# Patient Record
Sex: Female | Born: 1960 | Race: White | Hispanic: No | Smoking: Former smoker
Health system: Southern US, Community
[De-identification: ages and names within clinical notes are randomized; demographics above are authoritative.]

## PROBLEM LIST (undated history)

## (undated) DIAGNOSIS — M1990 Unspecified osteoarthritis, unspecified site: Secondary | ICD-10-CM

## (undated) DIAGNOSIS — K449 Diaphragmatic hernia without obstruction or gangrene: Secondary | ICD-10-CM

## (undated) DIAGNOSIS — T8859XA Other complications of anesthesia, initial encounter: Secondary | ICD-10-CM

## (undated) DIAGNOSIS — Z889 Allergy status to unspecified drugs, medicaments and biological substances status: Secondary | ICD-10-CM

## (undated) DIAGNOSIS — D649 Anemia, unspecified: Secondary | ICD-10-CM

## (undated) DIAGNOSIS — K8020 Calculus of gallbladder without cholecystitis without obstruction: Secondary | ICD-10-CM

## (undated) DIAGNOSIS — K219 Gastro-esophageal reflux disease without esophagitis: Secondary | ICD-10-CM

## (undated) DIAGNOSIS — T4145XA Adverse effect of unspecified anesthetic, initial encounter: Secondary | ICD-10-CM

## (undated) DIAGNOSIS — J3489 Other specified disorders of nose and nasal sinuses: Secondary | ICD-10-CM

## (undated) HISTORY — PX: COLONOSCOPY W/ POLYPECTOMY: SHX1380

## (undated) HISTORY — DX: Diaphragmatic hernia without obstruction or gangrene: K44.9

## (undated) HISTORY — DX: Unspecified osteoarthritis, unspecified site: M19.90

## (undated) HISTORY — DX: Gastro-esophageal reflux disease without esophagitis: K21.9

---

## 1999-11-12 ENCOUNTER — Encounter (INDEPENDENT_AMBULATORY_CARE_PROVIDER_SITE_OTHER): Payer: Self-pay | Admitting: *Deleted

## 1999-11-12 LAB — CONVERTED CEMR LAB

## 2000-07-15 ENCOUNTER — Other Ambulatory Visit: Admission: RE | Admit: 2000-07-15 | Discharge: 2000-07-15 | Payer: Self-pay | Admitting: *Deleted

## 2002-11-17 ENCOUNTER — Encounter: Admission: RE | Admit: 2002-11-17 | Discharge: 2002-11-17 | Payer: Self-pay | Admitting: Family Medicine

## 2005-01-07 ENCOUNTER — Ambulatory Visit: Payer: Self-pay | Admitting: Internal Medicine

## 2005-02-04 ENCOUNTER — Ambulatory Visit: Payer: Self-pay | Admitting: Internal Medicine

## 2005-03-12 ENCOUNTER — Ambulatory Visit: Payer: Self-pay | Admitting: Internal Medicine

## 2005-03-26 ENCOUNTER — Ambulatory Visit: Payer: Self-pay | Admitting: Gastroenterology

## 2005-03-29 ENCOUNTER — Ambulatory Visit: Payer: Self-pay | Admitting: Gastroenterology

## 2005-04-01 ENCOUNTER — Ambulatory Visit: Payer: Self-pay | Admitting: Gastroenterology

## 2005-04-01 DIAGNOSIS — K449 Diaphragmatic hernia without obstruction or gangrene: Secondary | ICD-10-CM

## 2005-04-01 HISTORY — DX: Diaphragmatic hernia without obstruction or gangrene: K44.9

## 2005-04-01 HISTORY — PX: ESOPHAGOGASTRODUODENOSCOPY: SHX1529

## 2005-09-10 ENCOUNTER — Ambulatory Visit: Payer: Self-pay | Admitting: Internal Medicine

## 2006-07-18 ENCOUNTER — Emergency Department (HOSPITAL_COMMUNITY): Admission: EM | Admit: 2006-07-18 | Discharge: 2006-07-18 | Payer: Self-pay | Admitting: Family Medicine

## 2007-01-08 DIAGNOSIS — J309 Allergic rhinitis, unspecified: Secondary | ICD-10-CM | POA: Insufficient documentation

## 2007-01-09 ENCOUNTER — Encounter (INDEPENDENT_AMBULATORY_CARE_PROVIDER_SITE_OTHER): Payer: Self-pay | Admitting: *Deleted

## 2007-02-06 ENCOUNTER — Ambulatory Visit: Payer: Self-pay | Admitting: Internal Medicine

## 2007-04-28 ENCOUNTER — Ambulatory Visit: Payer: Self-pay | Admitting: Internal Medicine

## 2007-05-18 ENCOUNTER — Encounter: Admission: RE | Admit: 2007-05-18 | Discharge: 2007-05-18 | Payer: Self-pay | Admitting: Orthopedic Surgery

## 2007-07-14 ENCOUNTER — Ambulatory Visit: Payer: Self-pay | Admitting: Internal Medicine

## 2007-07-14 DIAGNOSIS — S9030XA Contusion of unspecified foot, initial encounter: Secondary | ICD-10-CM | POA: Insufficient documentation

## 2007-11-26 ENCOUNTER — Ambulatory Visit: Payer: Self-pay | Admitting: Internal Medicine

## 2007-11-26 DIAGNOSIS — J069 Acute upper respiratory infection, unspecified: Secondary | ICD-10-CM | POA: Insufficient documentation

## 2008-03-22 ENCOUNTER — Ambulatory Visit: Payer: Self-pay | Admitting: Internal Medicine

## 2008-03-22 DIAGNOSIS — R1084 Generalized abdominal pain: Secondary | ICD-10-CM | POA: Insufficient documentation

## 2008-03-22 LAB — CONVERTED CEMR LAB
Bilirubin Urine: NEGATIVE
Blood in Urine, dipstick: NEGATIVE
Glucose, Urine, Semiquant: NEGATIVE
Ketones, urine, test strip: NEGATIVE
Nitrite: NEGATIVE
Specific Gravity, Urine: >=1.03
Urobilinogen, UA: 0.2
WBC Urine, dipstick: NEGATIVE
pH: 7

## 2008-03-23 ENCOUNTER — Encounter: Payer: Self-pay | Admitting: Internal Medicine

## 2008-05-03 ENCOUNTER — Encounter: Payer: Self-pay | Admitting: Internal Medicine

## 2010-01-03 ENCOUNTER — Telehealth (INDEPENDENT_AMBULATORY_CARE_PROVIDER_SITE_OTHER): Payer: Self-pay | Admitting: *Deleted

## 2010-01-18 ENCOUNTER — Encounter: Payer: Self-pay | Admitting: Internal Medicine

## 2010-01-25 ENCOUNTER — Encounter: Payer: Self-pay | Admitting: Internal Medicine

## 2010-12-11 NOTE — Letter (Signed)
Summary: Generic Letter  [facility]  [address] [place], [place] [address]   Phone: [phone]  Fax: [phone]    05/03/2008  Lezlie Capuano [address] [place], [place]  [address]  Dear Ms. Reeg,  We have been trying to get ahold of you about your urine culture from 03/23/08. We wanted to let you know that theres was some bacteria in the urine but not consistent with a urinary tract infection. If you did take the medication that was okay. If you do have any questions, please call us at [phone].         Sincerely,   Carollee Herter, CMA [place] [facility]

## 2010-12-11 NOTE — Assessment & Plan Note (Signed)
Summary: ?uti/njr   Vital Signs:  Patient Profile:   50 Years Old Female Weight:      197 pounds Temp:     98.2 degrees F oral Pulse rate:   78 / minute BP sitting:   120 / 80  (left arm) Cuff size:   regular  Vitals Entered By: Romualdo Bolk, CMA (Mar 22, 2008 4:27 PM)                 Chief Complaint:  Burning upon urination.  History of Present Illness: [name] [name] is here foru burning, pain and pressure upon urination that started 03/21/08.  ? no frequency.   pain at urethra.    period due this week.   no vaginal symptom  Has hx of uti in the past  no fever NVD . Pt is currently not on any medications.    Prior Medications Reviewed Using: Patient Recall  Updated Prior Medication List: No Medications Current Allergies (reviewed today): No known allergies   Past Medical History:    Reviewed history from 01/08/2007 and no changes required:       G1P1 s/p CS for breech 1993              hx utis  Past Surgical History:    Caesarean section   Social History:    Reviewed history from 01/08/2007 and no changes required:       Moved from [place] 1999.  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  50yo D.  Married.  No tob/EtOH/drugs.  Likes to read, garden, travel.    Review of Systems  The patient denies anorexia, fever, abdominal pain, hematuria, incontinence, and difficulty walking.     Physical Exam  General:     alert, well-developed, and well-nourished.   Head:     normocephalic and atraumatic.   Neck:     No deformities, masses, or tenderness noted. Lungs:     normal respiratory effort and no intercostal retractions.   Heart:     normal rate and regular rhythm.   Abdomen:     Bowel sounds positive,abdomen soft and non-tender without masses, organomegaly or hernias noted. flank pain Skin:     turgor normal and color normal.      Impression & Recommendations:  Problem # 1:  DYSURIA (ICD-788.1) do culture with past hx  and will call with results   discussion Her updated medication list for this problem includes:    Cipro 500 Mg Tabs (Ciprofloxacin hcl) .Marland Kitchen... 1 by mouth two times a day .   Complete Medication List: 1)  Cipro 500 Mg Tabs (Ciprofloxacin hcl) .Marland Kitchen.. 1 by mouth two times a day  Other Orders: UA Dipstick w/o Micro (automated)  (81003) T-Culture, Urine ([id number])   Patient Instructions: 1)  will call you when urine culture back 2)  if increasing symptom then can start cipro 3)  and call as needed not better.   Prescriptions: CIPRO 500 MG  TABS (CIPROFLOXACIN HCL) 1 by mouth two times a day  #10 x 0   Entered and Authorized by:   Madelin Headings MD   Signed by:   Madelin Headings MD on 03/22/2008   Method used:   Print then Give to Patient   RxID:   [id number]  ] Laboratory Results   Urine Tests  Date/Time Received: Mar 22, 2008 4:32 PM  Date/Time Reported: Mar 22, 2008 4:32 PM   Routine Urinalysis   Color: yellow Appearance: Clear  Glucose: negative   (Normal Range: Negative) Bilirubin: negative   (Normal Range: Negative) Ketone: negative   (Normal Range: Negative) Spec. Gravity: >=1.030   (Normal Range: 1.003-1.035) Blood: negative   (Normal Range: Negative) pH: 7.0   (Normal Range: 5.0-8.0) Protein: 1+   (Normal Range: Negative) Urobilinogen: 0.2   (Normal Range: 0-1) Nitrite: negative   (Normal Range: Negative) Leukocyte Esterace: negative   (Normal Range: Negative)    Comments: Darra Lis RMA  Mar 22, 2008 4:33 PM

## 2010-12-11 NOTE — Assessment & Plan Note (Signed)
Summary: sinus/mhf   Vital Signs:  Patient Profile:   50 Years Old Female Weight:      196 pounds Temp:     98.3 degrees F oral Pulse rate:   88 / minute BP sitting:   120 / 80  (right arm) Cuff size:   regular  Vitals Entered By: Romualdo Bolk, CMA (November 26, 2007 10:50 AM)                 Chief Complaint:  Sinus infection.  History of Present Illness: [name] [name] is here for a sinus infection that started 11/22/07 and had a fever 11/24/07. Pt has a sore throat, fever, cough, congestion and sinus pressure.     NO fever last night.   Thinks the st is from drainage. Tries Theraflu ? help. Saline.  Onset with dizzy and weak and drainage. Concerned about not getting sleep cough and st.    No face pain but is full in the face. Tired  during the day.No CP or sob.    Current Allergies (reviewed today): No known allergies   Past Medical History:    Reviewed history from 01/08/2007 and no changes required:       G1P1 s/p CS for breech 1993   Family History:    Reviewed history from 01/08/2007 and no changes required:       F, B-  healthy, GF-CAD, GM, Uncle- DM, GM-osteoporosis, M- Brca 79  Social History:    Reviewed history from 01/08/2007 and no changes required:       Moved from [place] 1999.  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  50yo D.  Married.  No tob/EtOH/drugs.  Likes to read, garden, travel.    Review of Systems  The patient denies chest pain, syncope, dyspnea on exhertion, hemoptysis, and abdominal pain.     Physical Exam  General:     well-developed, well-nourished, and well-hydrated. In NAD appears mildly ill  Head:     normocephalic, atraumatic, and no abnormalities observed.   Eyes:     pupils equal, pupils round, and pupils reactive to light.   Ears:     R ear normal, L ear normal, and no external deformities.   Nose:     no external deformity, no nasal discharge, mucosal erythema, and mucosal edema.   face  NT Mouth:     mildly red no edema  good airway. Neck:     No deformities, masses, or tenderness noted. Lungs:     Normal respiratory effort, chest expands symmetrically. Lungs are clear to auscultation, no crackles or wheezes. Skin:     turgor normal and color normal.   Cervical Nodes:     No lymphadenopathy noted    Impression & Recommendations:  Problem # 1:  URI (ICD-465.9) possible early sinusitis but at presetn looks like viral rti.   The following medications were removed from the medication list:    Mobic 15 Mg Tabs (Meloxicam)  Her updated medication list for this problem includes:    Hycodan 5-1.5 Mg/59ml Syrp (Hydrocodone-homatropine) .Marland Kitchen... 1-2 tsp by mouth q4-6 hours as needed cough Instructed on symptomatic treatment. Call if symptoms persist or worsen. or alarm sx.  Can try antihistamin hs for drainage and sudafed day for congestion.  Complete Medication List: 1)  Hycodan 5-1.5 Mg/60ml Syrp (Hydrocodone-homatropine) .Marland Kitchen.. 1-2 tsp by mouth q4-6 hours as needed cough     Prescriptions: HYCODAN 5-1.5 MG/5ML  SYRP (HYDROCODONE-HOMATROPINE) 1-2 tsp by mouth q4-6 hours  as needed cough  #6 ounces x 0   Entered and Authorized by:   Madelin Headings MD   Signed by:   Madelin Headings MD on 11/26/2007   Method used:   Print then Give to Patient   RxID:   [id number]  ]

## 2010-12-11 NOTE — Letter (Signed)
Summary: [facility]  [facility]   Imported By: Maryln Gottron 01/31/2010 12:33:08  _____________________________________________________________________  External Attachment:    Type:   Image     Comment:   External Document

## 2010-12-11 NOTE — Assessment & Plan Note (Signed)
Summary: foot swollen/njr  Medications Added MOBIC 15 MG  TABS (MELOXICAM)       Allergies Added: NKDA  Vital Signs:  Patient Profile:   50 Years Old Female Weight:      192 pounds Temp:     98.4 degrees F oral Pulse rate:   60 / minute BP sitting:   132 / 80  (right arm) Cuff size:   regular  Vitals Entered By: Romualdo Bolk, CMA (July 14, 2007 10:21 AM)                 Chief Complaint:  Rt foot swollen, dropped a dishwasher door on it on 8/30, can walk on it, and can move toes.  History of Present Illness: See above.     Treated with ice. Was able to walk, mostly driving yesterday. No increased pain with weight bearing.  Marland Kitchen No previous injury. Had been doing regular walking for health reasons and needs advice about whether its safe to resume this. Status is about the same as yesterday  No neurovascular symptom   Current Allergies (reviewed today): No known allergies     Risk Factors:  Tobacco use:  never Alcohol use:  yes    Type:  all types    Drinks per day:  <1 Exercise:  yes    Times per week:  5    Type:  Walking  Seatbelt use:  100 %   Review of Systems       as per hpi   Physical Exam  General:     well-developed, well-nourished, and well-hydrated.   Msk:     normal ROM, no joint tenderness, no redness over joints, and no joint deformities.  Swelling over dorsal area of r foot near great toe.  No point tenderness. Pulses:     nl foot pulses Neurologic:     grossly normal  Skin:     No bruising or abrasion.color normal.      Impression & Recommendations:  Problem # 1:  CONTUSION, RIGHT FOOT (ICD-924.20) [id number]  Recommend x ray today  R/O fracture.  and report to patient. Continue ice prn , Ice.  Wait on report Activity depending on results of x ray results.  Complete Medication List: 1)  Mobic 15 Mg Tabs (Meloxicam)

## 2010-12-11 NOTE — Progress Notes (Signed)
Summary: referral  Phone Note Call from Patient   Caller: Patient Call For: [name] [name] Summary of Call: Pt wants a referral for a Sports Physical [name].  Hip pain.......Marland Kitchenplease call and let her how to know how to proceed. [phone] Initial call taken by: Lynann Beaver CMA,  January 03, 2010 12:53 PM  Follow-up for Phone Call        usually if we do a referral it is from an office visit.    to evaluate a problem  . Otherwise    a person makes their own appt.  Follow-up by: [name] [name],  January 03, 2010 4:54 PM  Additional Follow-up for Phone Call Additional follow up Details #1::        LMOM. Additional Follow-up by: Raechel Ache, RN,  January 04, 2010 3:21 PM

## 2013-09-21 ENCOUNTER — Encounter (INDEPENDENT_AMBULATORY_CARE_PROVIDER_SITE_OTHER): Payer: Self-pay | Admitting: General Surgery

## 2013-09-21 ENCOUNTER — Telehealth (INDEPENDENT_AMBULATORY_CARE_PROVIDER_SITE_OTHER): Payer: Self-pay | Admitting: *Deleted

## 2013-09-21 ENCOUNTER — Ambulatory Visit (INDEPENDENT_AMBULATORY_CARE_PROVIDER_SITE_OTHER): Payer: Managed Care, Other (non HMO) | Admitting: General Surgery

## 2013-09-21 VITALS — BP 122/70 | HR 76 | Temp 97.6°F | Resp 14 | Ht 64.5 in | Wt 176.8 lb

## 2013-09-21 DIAGNOSIS — D1739 Benign lipomatous neoplasm of skin and subcutaneous tissue of other sites: Secondary | ICD-10-CM

## 2013-09-21 DIAGNOSIS — D171 Benign lipomatous neoplasm of skin and subcutaneous tissue of trunk: Secondary | ICD-10-CM

## 2013-09-21 NOTE — Progress Notes (Signed)
Subjective:     Patient ID: [name] [name], female   DOB: [birth year], 52 y.o.   MRN: [id number]  HPI This is a 52 year old female who is otherwise healthy who presents with a 3-5 year history of a mass just below her right breast. She has no symptoms referable to this at all. There is no history of any infection. She states that this has not changed in size since she first noted. She said that she underwent a mammogram in September of 2014 that was still most of the cyst that was negative and was told to followup in one year. She had no other real evaluation of this mass. She is due to have a total hip arthroplasty in early December. She does have a family history of breast cancer in her mother in her early 34s. She reports no issues ever with either breast for her.  Review of Systems  Constitutional: Negative for fever, chills and unexpected weight change.  HENT: Negative for congestion, hearing loss, sore throat, trouble swallowing and voice change.   Eyes: Negative for visual disturbance.  Respiratory: Negative for cough and wheezing.   Cardiovascular: Negative for chest pain, palpitations and leg swelling.  Gastrointestinal: Negative for nausea, vomiting, abdominal pain, diarrhea, constipation, blood in stool, abdominal distention and anal bleeding.  Genitourinary: Negative for hematuria, vaginal bleeding and difficulty urinating.  Musculoskeletal: Negative for arthralgias.  Skin: Negative for rash and wound.  Neurological: Negative for seizures, syncope and headaches.  Hematological: Negative for adenopathy. Does not bruise/bleed easily.  Psychiatric/Behavioral: Negative for confusion.       Objective:   Physical Exam  Vitals reviewed. Constitutional: She appears well-developed and well-nourished.  Pulmonary/Chest: Right breast exhibits no inverted nipple, no mass, no nipple discharge, no skin change and no tenderness.    Lymphadenopathy:       Right axillary: No pectoral and no  lateral adenopathy present.       Assessment:     Chest wall lipoma     Plan:     This clinically appears to be a lipoma . I told her I do not think without any symptoms or any rapid growth of this that it needs to be removed. I would be perfectly comfortable just following this. We discussed to her family history as well as the fact that there is a mass to follow obtaining an ultrasound just to get a baseline for any future comparison if it is needed. She was agreeable to that and we will set that up. I will followup by phone with her about that. If that is consistent with what it appears to be on her exam that I think following this a referral back if she has any other issues to be appropriate.

## 2013-09-21 NOTE — Telephone Encounter (Signed)
LMOM for pt to return my call.  I was calling to inform her of her appt at [facility] for breast ultrasound on 09/22/13 with an arrival time of 2:15pm.

## 2013-09-22 ENCOUNTER — Encounter (INDEPENDENT_AMBULATORY_CARE_PROVIDER_SITE_OTHER): Payer: Self-pay

## 2013-09-22 NOTE — Telephone Encounter (Signed)
Spoke with pt and informed her of appt information below.  She is agreeable with this appt. °

## 2013-09-27 ENCOUNTER — Encounter (INDEPENDENT_AMBULATORY_CARE_PROVIDER_SITE_OTHER): Payer: Self-pay

## 2013-10-08 ENCOUNTER — Encounter (HOSPITAL_COMMUNITY): Payer: Self-pay | Admitting: Pharmacy Technician

## 2013-10-13 ENCOUNTER — Encounter (HOSPITAL_COMMUNITY): Payer: Self-pay

## 2013-10-13 ENCOUNTER — Encounter (HOSPITAL_COMMUNITY)
Admission: RE | Admit: 2013-10-13 | Discharge: 2013-10-13 | Disposition: A | Discharge status: Home / Self Care | Payer: Managed Care, Other (non HMO) | Source: Ambulatory Visit | Attending: Orthopedic Surgery | Admitting: Orthopedic Surgery

## 2013-10-13 DIAGNOSIS — Z01812 Encounter for preprocedural laboratory examination: Secondary | ICD-10-CM | POA: Insufficient documentation

## 2013-10-13 HISTORY — DX: Allergy status to unspecified drugs, medicaments and biological substances: Z88.9

## 2013-10-13 LAB — BASIC METABOLIC PANEL
BUN: 19 mg/dL (ref 6–23)
CO2: 25 mEq/L (ref 19–32)
Calcium: 9.6 mg/dL (ref 8.4–10.5)
Chloride: 100 mEq/L (ref 96–112)
Creatinine, Ser: 0.76 mg/dL (ref 0.50–1.10)
GFR calc Af Amer: >90 mL/min (ref >90)
GFR calc non Af Amer: >90 mL/min (ref >90)
Glucose, Bld: 92 mg/dL (ref 70–99)
Potassium: 4.3 mEq/L (ref 3.5–5.1)
Sodium: 134 mEq/L — ABNORMAL LOW (ref 135–145)

## 2013-10-13 LAB — CBC
HCT: 42.8 % (ref 36.0–46.0)
Hemoglobin: 14.7 g/dL (ref 12.0–15.0)
MCH: 29.1 pg (ref 26.0–34.0)
MCHC: 34.3 g/dL (ref 30.0–36.0)
MCV: 84.8 fL (ref 78.0–100.0)
Platelets: 588 10*3/uL — ABNORMAL HIGH (ref 150–400)
RBC: 5.05 MIL/uL (ref 3.87–5.11)
RDW: 15.1 % (ref 11.5–15.5)
WBC: 7.3 10*3/uL (ref 4.0–10.5)

## 2013-10-13 LAB — URINALYSIS, ROUTINE W REFLEX MICROSCOPIC
Bilirubin Urine: NEGATIVE
Glucose, UA: NEGATIVE mg/dL
Hgb urine dipstick: NEGATIVE
Ketones, ur: NEGATIVE mg/dL
Leukocytes, UA: NEGATIVE
Nitrite: NEGATIVE
Protein, ur: NEGATIVE mg/dL
Specific Gravity, Urine: 1.03 (ref 1.005–1.030)
Urobilinogen, UA: 0.2 mg/dL (ref 0.0–1.0)
pH: 6 (ref 5.0–8.0)

## 2013-10-13 LAB — SURGICAL PCR SCREEN
MRSA, PCR: NEGATIVE
Staphylococcus aureus: NEGATIVE

## 2013-10-13 LAB — APTT: aPTT: 33 seconds (ref 24–37)

## 2013-10-13 LAB — HCG, SERUM, QUALITATIVE: Preg, Serum: NEGATIVE

## 2013-10-13 LAB — PROTIME-INR
INR: 1.09 (ref 0.00–1.49)
Prothrombin Time: 13.9 seconds (ref 11.6–15.2)

## 2013-10-13 NOTE — Patient Instructions (Addendum)
20 [name] [name]  10/13/2013   Your procedure is scheduled on:  12-9 -2014  Report to [facility] at    0730    AM.  Call this number if you have problems the morning of surgery: [phone]  Or Presurgical Testing [phone](Virgin Zellers)      Do not eat food:After Midnight.    Take these medicines the morning of surgery with A SIP OF WATER: Omeprazole. Flonase/Bring. Bring metronidazole cream   Do not wear jewelry, make-up or nail polish.  Do not wear lotions, powders, or perfumes. You may wear deodorant.  Do not shave 12 hours prior to first CHG shower(legs and under arms).(face and neck okay.)  Do not bring valuables to the hospital.  Contacts, dentures or removable bridgework, body piercing, hair pins may not be worn into surgery.  Leave suitcase in the car. After surgery it may be brought to your room.  For patients admitted to the hospital, checkout time is 11:00 AM the day of discharge.   Patients discharged the day of surgery will not be allowed to drive home. Must have responsible person with you x 24 hours once discharged.  Name and phone number of your driver: Juanell Fairly Carr-sister-n-law [phone].  Special Instructions: CHG(Chlorhedine 4%-"Hibiclens","Betasept","Aplicare") Shower Use Special Wash: see special instructions.(avoid face and genitals)   Please read over the following fact sheets that you were given: MRSA Information, Blood Transfusion fact sheet, Incentive Spirometry Instruction.  Remember : Type/Screen "Blue armbands" - may not be removed once applied(would result in being retested if removed).  Failure to follow these instructions may result in Cancellation of your surgery.   Patient signature_______________________________________________________

## 2013-10-14 NOTE — Pre-Procedure Instructions (Signed)
10-14-13 1600 patient notified of change in surgery time to 0845 AM, will arrive by 0615 AM to Short Stay.Other instructions unchanged.W. Kennon Portela

## 2013-10-17 NOTE — H&P (Signed)
TOTAL HIP ADMISSION H&P  Patient is admitted for left total hip arthroplasty, anterior approach.  Subjective:  Chief Complaint:    Left hip OA / pain  HPI: [name] [name], 52 y.o. female, has a history of pain and functional disability in the left hip(s) due to arthritis and patient has failed non-surgical conservative treatments for greater than 12 weeks to include NSAID's and/or analgesics and activity modification.  Onset of symptoms was abrupt starting 6+ years ago with gradually worsening course since that time.The patient noted no past surgery on the left hip(s).  Patient currently rates pain in the left hip at 7 out of 10 with activity. Patient has worsening of pain with activity and weight bearing, trendelenberg gait, pain that interfers with activities of daily living and pain with passive range of motion. Patient has evidence of periarticular osteophytes and joint space narrowing by imaging studies. This condition presents safety issues increasing the risk of falls. There is no current active infection.   Risks, benefits and expectations were discussed with the patient.  Risks including but not limited to the risk of anesthesia, blood clots, nerve damage, blood vessel damage, failure of the prosthesis, infection and up to and including death.  Patient understand the risks, benefits and expectations and wishes to proceed with surgery.   D/C Plans:   Home with HHPT  Post-op Meds:    No Rx given  Tranexamic Acid:   To be given  Decadron:    To be given  FYI:    ASA post-op  Norco post-op   Patient Active Problem List   Diagnosis Date Noted  . ABDOMINAL PAIN, GENERALIZED 03/22/2008  . URI 11/26/2007  . CONTUSION, RIGHT FOOT 07/14/2007  . RHINITIS, ALLERGIC 01/08/2007   Past Medical History  Diagnosis Date  . GERD (gastroesophageal reflux disease)   . H/O seasonal allergies   . Arthritis     osteoarthritis- hips/back    Past Surgical History  Procedure Laterality Date  .  Cesarean section    . Colonoscopy w/ polypectomy      No prescriptions prior to admission   Allergies  Allergen Reactions  . Nitrofurantoin Hives    History  Substance Use Topics  . Smoking status: Former Smoker    Quit date: 09/21/1985  . Smokeless tobacco: Never Used  . Alcohol Use: Yes     Comment: 1-2  glasses daily    Family History  Problem Relation Age of Onset  . Cancer Mother     breast     Review of Systems  Constitutional: Negative.   HENT: Negative.   Eyes: Negative.   Cardiovascular: Negative.   Gastrointestinal: Positive for heartburn and abdominal pain.  Genitourinary: Negative.   Musculoskeletal: Positive for joint pain.  Skin: Negative.   Neurological: Negative.   Endo/Heme/Allergies: Positive for environmental allergies.  Psychiatric/Behavioral: Negative.     Objective:  Physical Exam  Constitutional: She is oriented to person, place, and time. She appears well-developed and well-nourished.  HENT:  Head: Normocephalic and atraumatic.  Mouth/Throat: Oropharynx is clear and moist.  Eyes: Pupils are equal, round, and reactive to light.  Neck: Neck supple. No JVD present. No tracheal deviation present. No thyromegaly present.  Cardiovascular: Normal rate, regular rhythm, normal heart sounds and intact distal pulses.   Respiratory: Effort normal and breath sounds normal. No stridor. No respiratory distress. She has no wheezes.  GI: Soft. There is no tenderness. There is no guarding.  Musculoskeletal:       Left  hip: She exhibits decreased range of motion, decreased strength, tenderness and bony tenderness. She exhibits no swelling, no deformity and no laceration.  Lymphadenopathy:    She has no cervical adenopathy.  Neurological: She is alert and oriented to person, place, and time.  Skin: Skin is warm and dry.  Psychiatric: She has a normal mood and affect.     Imaging Review Plain radiographs demonstrate severe degenerative joint disease of  the left hip(s). The bone quality appears to be good for age and reported activity level.  Assessment/Plan:  End stage arthritis, left hip(s)  The patient history, physical examination, clinical judgement of the provider and imaging studies are consistent with end stage degenerative joint disease of the left hip(s) and total hip arthroplasty is deemed medically necessary. The treatment options including medical management, injection therapy, arthroscopy and arthroplasty were discussed at length. The risks and benefits of total hip arthroplasty were presented and reviewed. The risks due to aseptic loosening, infection, stiffness, dislocation/subluxation,  thromboembolic complications and other imponderables were discussed.  The patient acknowledged the explanation, agreed to proceed with the plan and consent was signed. Patient is being admitted for inpatient treatment for surgery, pain control, PT, OT, prophylactic antibiotics, VTE prophylaxis, progressive ambulation and ADL's and discharge planning.The patient is planning to be discharged home with home health services.     Anastasio Auerbach Bertran Zeimet   PAC  10/17/2013, 7:22 PM

## 2013-10-18 DIAGNOSIS — J3489 Other specified disorders of nose and nasal sinuses: Secondary | ICD-10-CM

## 2013-10-18 HISTORY — DX: Other specified disorders of nose and nasal sinuses: J34.89

## 2013-10-19 ENCOUNTER — Encounter (HOSPITAL_COMMUNITY)
Admission: RE | Disposition: A | Discharge status: Home Health | Payer: Self-pay | Source: Ambulatory Visit | Attending: Orthopedic Surgery

## 2013-10-19 ENCOUNTER — Encounter (HOSPITAL_COMMUNITY): Payer: Managed Care, Other (non HMO) | Admitting: *Deleted

## 2013-10-19 ENCOUNTER — Inpatient Hospital Stay (HOSPITAL_COMMUNITY): Payer: Managed Care, Other (non HMO) | Admitting: *Deleted

## 2013-10-19 ENCOUNTER — Inpatient Hospital Stay (HOSPITAL_COMMUNITY): Payer: Managed Care, Other (non HMO)

## 2013-10-19 ENCOUNTER — Inpatient Hospital Stay (HOSPITAL_COMMUNITY)
Admission: RE | Admit: 2013-10-19 | Discharge: 2013-10-21 | DRG: 470 | Disposition: A | Discharge status: Home Health | Payer: Managed Care, Other (non HMO) | Source: Ambulatory Visit | Attending: Orthopedic Surgery | Admitting: Orthopedic Surgery

## 2013-10-19 ENCOUNTER — Encounter (HOSPITAL_COMMUNITY): Payer: Self-pay | Admitting: *Deleted

## 2013-10-19 DIAGNOSIS — Z6829 Body mass index (BMI) 29.0-29.9, adult: Secondary | ICD-10-CM

## 2013-10-19 DIAGNOSIS — E663 Overweight: Secondary | ICD-10-CM

## 2013-10-19 DIAGNOSIS — Y831 Surgical operation with implant of artificial internal device as the cause of abnormal reaction of the patient, or of later complication, without mention of misadventure at the time of the procedure: Secondary | ICD-10-CM | POA: Diagnosis not present

## 2013-10-19 DIAGNOSIS — Z96649 Presence of unspecified artificial hip joint: Secondary | ICD-10-CM

## 2013-10-19 DIAGNOSIS — Y9210 Unspecified residential institution as the place of occurrence of the external cause: Secondary | ICD-10-CM | POA: Diagnosis present

## 2013-10-19 DIAGNOSIS — M1610 Unilateral primary osteoarthritis, unspecified hip: Principal | ICD-10-CM | POA: Diagnosis present

## 2013-10-19 DIAGNOSIS — I519 Heart disease, unspecified: Secondary | ICD-10-CM | POA: Diagnosis not present

## 2013-10-19 DIAGNOSIS — D62 Acute posthemorrhagic anemia: Secondary | ICD-10-CM | POA: Diagnosis not present

## 2013-10-19 DIAGNOSIS — Z01812 Encounter for preprocedural laboratory examination: Secondary | ICD-10-CM

## 2013-10-19 DIAGNOSIS — D500 Iron deficiency anemia secondary to blood loss (chronic): Secondary | ICD-10-CM | POA: Diagnosis not present

## 2013-10-19 DIAGNOSIS — K219 Gastro-esophageal reflux disease without esophagitis: Secondary | ICD-10-CM | POA: Diagnosis present

## 2013-10-19 DIAGNOSIS — Z87891 Personal history of nicotine dependence: Secondary | ICD-10-CM

## 2013-10-19 DIAGNOSIS — I498 Other specified cardiac arrhythmias: Secondary | ICD-10-CM | POA: Diagnosis not present

## 2013-10-19 DIAGNOSIS — Z883 Allergy status to other anti-infective agents status: Secondary | ICD-10-CM

## 2013-10-19 DIAGNOSIS — M169 Osteoarthritis of hip, unspecified: Principal | ICD-10-CM | POA: Diagnosis present

## 2013-10-19 HISTORY — DX: Other specified disorders of nose and nasal sinuses: J34.89

## 2013-10-19 HISTORY — PX: TOTAL HIP ARTHROPLASTY: SHX124

## 2013-10-19 LAB — TYPE AND SCREEN
ABO/RH(D): A POS
Antibody Screen: NEGATIVE

## 2013-10-19 LAB — ABO/RH: ABO/RH(D): A POS

## 2013-10-19 LAB — HEMOGLOBIN AND HEMATOCRIT, BLOOD
HCT: 31.3 % — ABNORMAL LOW (ref 36.0–46.0)
Hemoglobin: 10.9 g/dL — ABNORMAL LOW (ref 12.0–15.0)

## 2013-10-19 SURGERY — ARTHROPLASTY, HIP, TOTAL, ANTERIOR APPROACH
Anesthesia: Spinal | Site: Hip | Laterality: Left

## 2013-10-19 MED ORDER — MIDAZOLAM HCL 2 MG/2ML IJ SOLN
INTRAMUSCULAR | Status: AC
  Filled 2013-10-19: qty 2

## 2013-10-19 MED ORDER — FLEET ENEMA 7-19 GM/118ML RE ENEM: 1.0000 | ENEMA | Freq: Once | RECTAL | Status: AC | PRN

## 2013-10-19 MED ORDER — BISACODYL 10 MG RE SUPP: 10.0000 mg | Freq: Every day | RECTAL | Status: DC | PRN

## 2013-10-19 MED ORDER — CEFAZOLIN SODIUM-DEXTROSE 2-3 GM-% IV SOLR
2.0000 g | INTRAVENOUS | Status: AC
  Administered 2013-10-19: 2 g via INTRAVENOUS

## 2013-10-19 MED ORDER — SODIUM CHLORIDE 0.9 % IV BOLUS (SEPSIS)
500.0000 mL | Freq: Once | INTRAVENOUS | Status: AC
  Administered 2013-10-19: 500 mL via INTRAVENOUS

## 2013-10-19 MED ORDER — ZOLPIDEM TARTRATE 5 MG PO TABS: 5.0000 mg | ORAL_TABLET | Freq: Every evening | ORAL | Status: DC | PRN

## 2013-10-19 MED ORDER — HYDROCODONE-ACETAMINOPHEN 7.5-325 MG PO TABS
1.0000 | ORAL_TABLET | ORAL | Status: DC
  Administered 2013-10-19 (×2): 1 via ORAL
  Administered 2013-10-20: 2 via ORAL
  Administered 2013-10-20 (×2): 1 via ORAL
  Administered 2013-10-20: 2 via ORAL
  Administered 2013-10-20 – 2013-10-21 (×2): 1 via ORAL
  Administered 2013-10-21: 01:00:00 2 via ORAL
  Filled 2013-10-19: qty 1
  Filled 2013-10-19: qty 2
  Filled 2013-10-19 (×3): qty 1
  Filled 2013-10-19 (×2): qty 2
  Filled 2013-10-19 (×2): qty 1
  Filled 2013-10-19 (×2): qty 2

## 2013-10-19 MED ORDER — DOCUSATE SODIUM 100 MG PO CAPS
100.0000 mg | ORAL_CAPSULE | Freq: Two times a day (BID) | ORAL | Status: DC
  Administered 2013-10-19 – 2013-10-21 (×4): 100 mg via ORAL

## 2013-10-19 MED ORDER — PROPOFOL 10 MG/ML IV BOLUS
INTRAVENOUS | Status: AC
  Filled 2013-10-19: qty 20

## 2013-10-19 MED ORDER — PROPOFOL INFUSION 10 MG/ML OPTIME
INTRAVENOUS | Status: DC | PRN
  Administered 2013-10-19: 75 ug/kg/min via INTRAVENOUS

## 2013-10-19 MED ORDER — HYDROMORPHONE HCL PF 1 MG/ML IJ SOLN
INTRAMUSCULAR | Status: AC
  Administered 2013-10-19: 0.5 mg via INTRAVENOUS
  Filled 2013-10-19: qty 1

## 2013-10-19 MED ORDER — DEXAMETHASONE SODIUM PHOSPHATE 10 MG/ML IJ SOLN
10.0000 mg | Freq: Once | INTRAMUSCULAR | Status: AC
  Administered 2013-10-20: 10 mg via INTRAVENOUS
  Filled 2013-10-19: qty 1

## 2013-10-19 MED ORDER — TRANEXAMIC ACID 100 MG/ML IV SOLN
1000.0000 mg | Freq: Once | INTRAVENOUS | Status: AC
  Administered 2013-10-19: 1000 mg via INTRAVENOUS
  Filled 2013-10-19: qty 10

## 2013-10-19 MED ORDER — ATROPINE SULFATE 1 MG/ML IJ SOLN
INTRAMUSCULAR | Status: DC | PRN
  Administered 2013-10-19: 0.4 mg via INTRAVENOUS

## 2013-10-19 MED ORDER — CHLORHEXIDINE GLUCONATE 4 % EX LIQD: 60.0000 mL | Freq: Once | CUTANEOUS | Status: DC

## 2013-10-19 MED ORDER — POLYETHYLENE GLYCOL 3350 17 G PO PACK
17.0000 g | PACK | Freq: Two times a day (BID) | ORAL | Status: DC
  Administered 2013-10-21: 10:00:00 17 g via ORAL

## 2013-10-19 MED ORDER — MENTHOL 3 MG MT LOZG
1.0000 | LOZENGE | OROMUCOSAL | Status: DC | PRN
  Filled 2013-10-19: qty 9

## 2013-10-19 MED ORDER — PROMETHAZINE HCL 25 MG/ML IJ SOLN: 6.2500 mg | INTRAMUSCULAR | Status: DC | PRN

## 2013-10-19 MED ORDER — METHOCARBAMOL 100 MG/ML IJ SOLN
500.0000 mg | Freq: Four times a day (QID) | INTRAVENOUS | Status: DC | PRN
  Administered 2013-10-19: 500 mg via INTRAVENOUS
  Filled 2013-10-19 (×2): qty 5

## 2013-10-19 MED ORDER — ONDANSETRON HCL 4 MG PO TABS: 4.0000 mg | ORAL_TABLET | Freq: Four times a day (QID) | ORAL | Status: DC | PRN

## 2013-10-19 MED ORDER — SODIUM CHLORIDE 0.9 % IV SOLN
10.0000 mg | INTRAVENOUS | Status: DC | PRN
  Administered 2013-10-19: 50 ug/min via INTRAVENOUS

## 2013-10-19 MED ORDER — METHOCARBAMOL 500 MG PO TABS
500.0000 mg | ORAL_TABLET | Freq: Four times a day (QID) | ORAL | Status: DC | PRN
  Administered 2013-10-19 – 2013-10-21 (×5): 500 mg via ORAL
  Filled 2013-10-19 (×4): qty 1

## 2013-10-19 MED ORDER — FENTANYL CITRATE 0.05 MG/ML IJ SOLN
INTRAMUSCULAR | Status: AC
  Filled 2013-10-19: qty 2

## 2013-10-19 MED ORDER — CELECOXIB 200 MG PO CAPS
200.0000 mg | ORAL_CAPSULE | Freq: Two times a day (BID) | ORAL | Status: DC
  Administered 2013-10-19 – 2013-10-21 (×4): 200 mg via ORAL
  Filled 2013-10-19 (×5): qty 1

## 2013-10-19 MED ORDER — DEXAMETHASONE SODIUM PHOSPHATE 10 MG/ML IJ SOLN
INTRAMUSCULAR | Status: AC
  Filled 2013-10-19: qty 1

## 2013-10-19 MED ORDER — METRONIDAZOLE 1 % EX CREA
1.0000 "application " | TOPICAL_CREAM | Freq: Every day | CUTANEOUS | Status: DC
  Administered 2013-10-20: 1 via TOPICAL

## 2013-10-19 MED ORDER — DEXAMETHASONE SODIUM PHOSPHATE 10 MG/ML IJ SOLN
10.0000 mg | Freq: Once | INTRAMUSCULAR | Status: AC
  Administered 2013-10-19: 10 mg via INTRAVENOUS

## 2013-10-19 MED ORDER — PHENYLEPHRINE 40 MCG/ML (10ML) SYRINGE FOR IV PUSH (FOR BLOOD PRESSURE SUPPORT)
PREFILLED_SYRINGE | INTRAVENOUS | Status: AC
  Filled 2013-10-19: qty 20

## 2013-10-19 MED ORDER — MIDAZOLAM HCL 5 MG/5ML IJ SOLN
INTRAMUSCULAR | Status: DC | PRN
  Administered 2013-10-19 (×5): 1 mg via INTRAVENOUS

## 2013-10-19 MED ORDER — PANTOPRAZOLE SODIUM 40 MG PO TBEC
80.0000 mg | DELAYED_RELEASE_TABLET | Freq: Every day | ORAL | Status: DC
  Administered 2013-10-20 – 2013-10-21 (×2): 80 mg via ORAL
  Filled 2013-10-19 (×3): qty 2

## 2013-10-19 MED ORDER — ONDANSETRON HCL 4 MG/2ML IJ SOLN
INTRAMUSCULAR | Status: DC | PRN
  Administered 2013-10-19: 4 mg via INTRAVENOUS

## 2013-10-19 MED ORDER — FLUTICASONE PROPIONATE 50 MCG/ACT NA SUSP
1.0000 | Freq: Every day | NASAL | Status: DC
  Administered 2013-10-20: 1 via NASAL
  Filled 2013-10-19: qty 16

## 2013-10-19 MED ORDER — STERILE WATER FOR IRRIGATION IR SOLN
Status: DC | PRN
  Administered 2013-10-19: 3000 mL

## 2013-10-19 MED ORDER — SODIUM CHLORIDE 0.9 % IV SOLN: INTRAVENOUS | Status: DC

## 2013-10-19 MED ORDER — PHENYLEPHRINE HCL 10 MG/ML IJ SOLN
INTRAMUSCULAR | Status: DC | PRN
  Administered 2013-10-19: 60 ug via INTRAVENOUS
  Administered 2013-10-19: 40 ug via INTRAVENOUS
  Administered 2013-10-19 (×2): 80 ug via INTRAVENOUS

## 2013-10-19 MED ORDER — ONDANSETRON HCL 4 MG/2ML IJ SOLN: 4.0000 mg | Freq: Four times a day (QID) | INTRAMUSCULAR | Status: DC | PRN

## 2013-10-19 MED ORDER — METOCLOPRAMIDE HCL 10 MG PO TABS: 5.0000 mg | ORAL_TABLET | Freq: Three times a day (TID) | ORAL | Status: DC | PRN

## 2013-10-19 MED ORDER — LIDOCAINE HCL (CARDIAC) 20 MG/ML IV SOLN
INTRAVENOUS | Status: DC | PRN
  Administered 2013-10-19: 60 mg via INTRAVENOUS

## 2013-10-19 MED ORDER — ESMOLOL HCL 10 MG/ML IV SOLN
INTRAVENOUS | Status: DC | PRN
  Administered 2013-10-19: 30 mg via INTRAVENOUS
  Administered 2013-10-19: 50 mg via INTRAVENOUS
  Administered 2013-10-19: 20 mg via INTRAVENOUS

## 2013-10-19 MED ORDER — HYDROMORPHONE HCL PF 1 MG/ML IJ SOLN
0.5000 mg | INTRAMUSCULAR | Status: DC | PRN
  Administered 2013-10-19 (×3): 0.5 mg via INTRAVENOUS
  Filled 2013-10-19 (×4): qty 1

## 2013-10-19 MED ORDER — LIDOCAINE HCL (CARDIAC) 20 MG/ML IV SOLN
INTRAVENOUS | Status: AC
  Filled 2013-10-19: qty 5

## 2013-10-19 MED ORDER — ALUM & MAG HYDROXIDE-SIMETH 200-200-20 MG/5ML PO SUSP: 30.0000 mL | ORAL | Status: DC | PRN

## 2013-10-19 MED ORDER — ASPIRIN EC 325 MG PO TBEC
325.0000 mg | DELAYED_RELEASE_TABLET | Freq: Two times a day (BID) | ORAL | Status: DC
  Administered 2013-10-20 – 2013-10-21 (×3): 325 mg via ORAL
  Filled 2013-10-19 (×5): qty 1

## 2013-10-19 MED ORDER — CYCLOSPORINE 0.05 % OP EMUL
1.0000 [drp] | Freq: Two times a day (BID) | OPHTHALMIC | Status: DC
  Administered 2013-10-19 – 2013-10-20 (×3): 1 [drp] via OPHTHALMIC
  Filled 2013-10-19 (×5): qty 1

## 2013-10-19 MED ORDER — SODIUM CHLORIDE 0.9 % IV SOLN: 30.0000 ug/min | INTRAVENOUS | Status: DC

## 2013-10-19 MED ORDER — ATROPINE SULFATE 0.4 MG/ML IJ SOLN
INTRAMUSCULAR | Status: AC
  Filled 2013-10-19: qty 1

## 2013-10-19 MED ORDER — PHENOL 1.4 % MT LIQD
1.0000 | OROMUCOSAL | Status: DC | PRN
  Filled 2013-10-19: qty 177

## 2013-10-19 MED ORDER — EPINEPHRINE HCL 0.1 MG/ML IJ SOSY
PREFILLED_SYRINGE | INTRAMUSCULAR | Status: AC
  Filled 2013-10-19: qty 10

## 2013-10-19 MED ORDER — LACTATED RINGERS IV SOLN
INTRAVENOUS | Status: DC | PRN
  Administered 2013-10-19 (×4): via INTRAVENOUS

## 2013-10-19 MED ORDER — CEFAZOLIN SODIUM-DEXTROSE 2-3 GM-% IV SOLR
2.0000 g | Freq: Four times a day (QID) | INTRAVENOUS | Status: AC
  Administered 2013-10-19 (×2): 2 g via INTRAVENOUS
  Filled 2013-10-19 (×2): qty 50

## 2013-10-19 MED ORDER — CEFAZOLIN SODIUM-DEXTROSE 2-3 GM-% IV SOLR
INTRAVENOUS | Status: AC
  Filled 2013-10-19: qty 50

## 2013-10-19 MED ORDER — FERROUS SULFATE 325 (65 FE) MG PO TABS
325.0000 mg | ORAL_TABLET | Freq: Three times a day (TID) | ORAL | Status: DC
  Administered 2013-10-20 – 2013-10-21 (×3): 325 mg via ORAL
  Filled 2013-10-19 (×8): qty 1

## 2013-10-19 MED ORDER — DIPHENHYDRAMINE HCL 25 MG PO CAPS: 25.0000 mg | ORAL_CAPSULE | Freq: Four times a day (QID) | ORAL | Status: DC | PRN

## 2013-10-19 MED ORDER — BUPIVACAINE HCL (PF) 0.5 % IJ SOLN
INTRAMUSCULAR | Status: DC | PRN
  Administered 2013-10-19: 3 mL

## 2013-10-19 MED ORDER — SODIUM CHLORIDE 0.9 % IR SOLN
Status: DC | PRN
  Administered 2013-10-19: 1000 mL

## 2013-10-19 MED ORDER — EPHEDRINE SULFATE 50 MG/ML IJ SOLN
INTRAMUSCULAR | Status: AC
  Filled 2013-10-19: qty 1

## 2013-10-19 MED ORDER — GLYCOPYRROLATE 0.2 MG/ML IJ SOLN
INTRAMUSCULAR | Status: DC | PRN
  Administered 2013-10-19 (×2): 0.1 mg via INTRAVENOUS

## 2013-10-19 MED ORDER — METOCLOPRAMIDE HCL 5 MG/ML IJ SOLN: 5.0000 mg | Freq: Three times a day (TID) | INTRAMUSCULAR | Status: DC | PRN

## 2013-10-19 MED ORDER — FENTANYL CITRATE 0.05 MG/ML IJ SOLN
INTRAMUSCULAR | Status: DC | PRN
  Administered 2013-10-19: 100 ug via INTRAVENOUS

## 2013-10-19 MED ORDER — ESMOLOL HCL 10 MG/ML IV SOLN
INTRAVENOUS | Status: AC
  Filled 2013-10-19: qty 10

## 2013-10-19 MED ORDER — EPINEPHRINE HCL 0.1 MG/ML IJ SOSY
PREFILLED_SYRINGE | INTRAMUSCULAR | Status: DC | PRN
  Administered 2013-10-19: 300 ug via INTRAVENOUS

## 2013-10-19 MED ORDER — SODIUM CHLORIDE 0.9 % IV SOLN
100.0000 mL/h | INTRAVENOUS | Status: DC
  Administered 2013-10-19 – 2013-10-20 (×3): 100 mL/h via INTRAVENOUS
  Filled 2013-10-19 (×12): qty 1000

## 2013-10-19 MED ORDER — MIDODRINE HCL 5 MG PO TABS
20.0000 mg | ORAL_TABLET | Freq: Once | ORAL | Status: AC
  Administered 2013-10-19: 10 mg via ORAL
  Filled 2013-10-19: qty 4

## 2013-10-19 MED ORDER — HYDROMORPHONE HCL PF 1 MG/ML IJ SOLN
0.2500 mg | INTRAMUSCULAR | Status: DC | PRN
Start: 2013-10-19 — End: 2013-10-19
  Administered 2013-10-19: 0.5 mg via INTRAVENOUS
  Administered 2013-10-19: 0.25 mg via INTRAVENOUS
  Administered 2013-10-19 (×2): 0.5 mg via INTRAVENOUS
  Administered 2013-10-19: 0.25 mg via INTRAVENOUS

## 2013-10-19 SURGICAL SUPPLY — 38 items
ADH SKN CLS APL DERMABOND .7 (GAUZE/BANDAGES/DRESSINGS) ×1
BAG SPEC THK2 15X12 ZIP CLS (MISCELLANEOUS)
BAG ZIPLOCK 12X15 (MISCELLANEOUS) IMPLANT
BLADE SAW SGTL 18X1.27X75 (BLADE) ×2 IMPLANT
CAPT HIP PF COP ×2 IMPLANT
DERMABOND ADVANCED (GAUZE/BANDAGES/DRESSINGS) ×1
DERMABOND ADVANCED .7 DNX12 (GAUZE/BANDAGES/DRESSINGS) ×1 IMPLANT
DRAPE C-ARM 42X120 X-RAY (DRAPES) ×2 IMPLANT
DRAPE STERI IOBAN 125X83 (DRAPES) ×2 IMPLANT
DRAPE U-SHAPE 47X51 STRL (DRAPES) ×6 IMPLANT
DRSG AQUACEL AG ADV 3.5X10 (GAUZE/BANDAGES/DRESSINGS) ×2 IMPLANT
DRSG TEGADERM 4X4.75 (GAUZE/BANDAGES/DRESSINGS) IMPLANT
DURAPREP 26ML APPLICATOR (WOUND CARE) ×2 IMPLANT
ELECT BLADE TIP CTD 4 INCH (ELECTRODE) ×2 IMPLANT
ELECT REM PT RETURN 9FT ADLT (ELECTROSURGICAL) ×2
ELECTRODE REM PT RTRN 9FT ADLT (ELECTROSURGICAL) ×1 IMPLANT
EVACUATOR 1/8 PVC DRAIN (DRAIN) IMPLANT
FACESHIELD LNG OPTICON STERILE (SAFETY) ×8 IMPLANT
GAUZE SPONGE 2X2 8PLY STRL LF (GAUZE/BANDAGES/DRESSINGS) IMPLANT
GLOVE BIOGEL PI IND STRL 7.5 (GLOVE) ×1 IMPLANT
GLOVE BIOGEL PI IND STRL 8 (GLOVE) ×1 IMPLANT
GLOVE BIOGEL PI INDICATOR 7.5 (GLOVE) ×1
GLOVE BIOGEL PI INDICATOR 8 (GLOVE) ×1
GLOVE ECLIPSE 8.0 STRL XLNG CF (GLOVE) ×2 IMPLANT
GLOVE ORTHO TXT STRL SZ7.5 (GLOVE) ×4 IMPLANT
GOWN BRE IMP PREV XXLGXLNG (GOWN DISPOSABLE) ×2 IMPLANT
GOWN PREVENTION PLUS LG XLONG (DISPOSABLE) ×2 IMPLANT
KIT BASIN OR (CUSTOM PROCEDURE TRAY) ×2 IMPLANT
PACK TOTAL JOINT (CUSTOM PROCEDURE TRAY) ×2 IMPLANT
PADDING CAST COTTON 6X4 STRL (CAST SUPPLIES) ×2 IMPLANT
SPONGE GAUZE 2X2 STER 10/PKG (GAUZE/BANDAGES/DRESSINGS)
SUT MNCRL AB 4-0 PS2 18 (SUTURE) ×2 IMPLANT
SUT VIC AB 1 CT1 36 (SUTURE) ×6 IMPLANT
SUT VIC AB 2-0 CT1 27 (SUTURE) ×6
SUT VIC AB 2-0 CT1 TAPERPNT 27 (SUTURE) ×3 IMPLANT
SUT VLOC 180 0 24IN GS25 (SUTURE) ×2 IMPLANT
TOWEL OR 17X26 10 PK STRL BLUE (TOWEL DISPOSABLE) ×2 IMPLANT
TRAY FOLEY CATH 14FRSI W/METER (CATHETERS) IMPLANT

## 2013-10-19 NOTE — Anesthesia Procedure Notes (Signed)

## 2013-10-19 NOTE — Interval H&P Note (Signed)
History and Physical Interval Note:  10/19/2013 7:11 AM  [name] [name]  has presented today for surgery, with the diagnosis of OSTEOARTHRITIS LEFT HIP  The various methods of treatment have been discussed with the patient and family. After consideration of risks, benefits and other options for treatment, the patient has consented to  Procedure(s): LEFT TOTAL HIP ARTHROPLASTY ANTERIOR APPROACH; LEFT (Left) as a surgical intervention .  The patient's history has been reviewed, patient examined, no change in status, stable for surgery.  I have reviewed the patient's chart and labs.  Questions were answered to the patient's satisfaction.     Shelda Pal

## 2013-10-19 NOTE — Progress Notes (Signed)
PACU note: bp 95/36 (43) heart rate 92. Dr Okey Dupre notified, new order for LR 700 cc,s bolus . Started.

## 2013-10-19 NOTE — Transfer of Care (Signed)
Immediate Anesthesia Transfer of Care Note  Patient: [name] [name]  Procedure(s) Performed: Procedure(s): LEFT TOTAL HIP ARTHROPLASTY ANTERIOR APPROACH; LEFT (Left)  Patient Location: PACU  Anesthesia Type:Spinal  Level of Consciousness: awake and patient cooperative  Airway & Oxygen Therapy: Patient Spontanous Breathing and Patient connected to face mask oxygen  Post-op Assessment: Report given to PACU RN and Post -op Vital signs reviewed and stable  Post vital signs: Reviewed and stable  Complications: No apparent anesthesia complications

## 2013-10-19 NOTE — Anesthesia Postprocedure Evaluation (Signed)
  Anesthesia Post-op Note  Patient: [name] [name]  Procedure(s) Performed: Procedure(s) (LRB): LEFT TOTAL HIP ARTHROPLASTY ANTERIOR APPROACH; LEFT (Left)  Patient Location: PACU  Anesthesia Type: Spinal  Level of Consciousness: awake and alert   Airway and Oxygen Therapy: Patient Spontanous Breathing  Post-op Pain: mild  Post-op Assessment: Post-op Vital signs reviewed, Patient's Cardiovascular Status Stable, Respiratory Function Stable, Patent Airway and No signs of Nausea or vomiting  Last Vitals:  Filed Vitals:   10/19/13 1200  BP: 95/54  Pulse: 91  Temp:   Resp: 21    Post-op Vital Signs: stable   Complications: No apparent anesthesia complications

## 2013-10-19 NOTE — Op Note (Signed)
NAME:  [name] [name]                ACCOUNT NO.: [id number]      MEDICAL RECORD NO.: [id number]      FACILITY:  [facility]      PHYSICIAN:  Durene Romans D  DATE OF BIRTH:  [birth year]     DATE OF PROCEDURE:  10/19/2013                                 OPERATIVE REPORT         PREOPERATIVE DIAGNOSIS: Left  hip osteoarthritis.      POSTOPERATIVE DIAGNOSIS:  Left hip osteoarthritis.      PROCEDURE:  Left total hip replacement through an anterior approach   utilizing DePuy THR system, component size 52mm pinnacle cup, a size 36+4 neutral   Altrex liner, a size 2 Hi Tri Lock stem with a 36+1.5 delta ceramic   ball.      SURGEON:  Madlyn Frankel. Charlann Boxer, M.D.      ASSISTANT:  Lanney Gins, PA-C     ANESTHESIA:  Spinal.      SPECIMENS:  None.      COMPLICATIONS:  None.      BLOOD LOSS:  1000 cc     DRAINS:  None.      INDICATION OF THE PROCEDURE:  [name] [name] is a 52 y.o. female who had   presented to office for evaluation of left hip pain.  Radiographs revealed   progressive degenerative changes with bone-on-bone   articulation to the  hip joint.  The patient had painful limited range of   motion significantly affecting their overall quality of life.  The patient was failing to    respond to conservative measures, and at this point was ready   to proceed with more definitive measures.  The patient has noted progressive   degenerative changes in his hip, progressive problems and dysfunction   with regarding the hip prior to surgery.  Consent was obtained for   benefit of pain relief.  Specific risk of infection, DVT, component   failure, dislocation, need for revision surgery, as well discussion of   the anterior versus posterior approach were reviewed.  Consent was   obtained for benefit of anterior pain relief through an anterior   approach.      PROCEDURE IN DETAIL:  The patient was brought to operative theater.   Once adequate anesthesia,  preoperative antibiotics, 2gm Ancef administered.   The patient was positioned supine on the OSI Hanna table.  Once adequate   padding of boney process was carried out, we had predraped out the hip, and  used fluoroscopy to confirm orientation of the pelvis and position.      The left hip was then prepped and draped from proximal iliac crest to   mid thigh with shower curtain technique.      Time-out was performed identifying the patient, planned procedure, and   extremity.     An incision was then made 2 cm distal and lateral to the   anterior superior iliac spine extending over the orientation of the   tensor fascia lata muscle and sharp dissection was carried down to the   fascia of the muscle and protractor placed in the soft tissues.      The fascia was then incised.  The muscle belly was identified and swept   laterally  and retractor placed along the superior neck.  Following   cauterization of the circumflex vessels and removing some pericapsular   fat, a second cobra retractor was placed on the inferior neck.  A third   retractor was placed on the anterior acetabulum after elevating the   anterior rectus.  A L-capsulotomy was along the line of the   superior neck to the trochanteric fossa, then extended proximally and   distally.  Tag sutures were placed and the retractors were then placed   intracapsular.  We then identified the trochanteric fossa and   orientation of my neck cut, confirmed this radiographically   and then made a neck osteotomy with the femur on traction.  The femoral   head was removed without difficulty or complication.  Traction was let   off and retractors were placed posterior and anterior around the   acetabulum.      The labrum and foveal tissue were debrided.  I began reaming with a 47mm   reamer and reamed up to 51mm reamer with good bony bed preparation and a 52   cup was chosen.  The final 52mm Pinnacle cup was then impacted under fluoroscopy  to  confirm the depth of penetration and orientation with respect to   abduction.  A screw was placed followed by the hole eliminator.  The final   36+4 neutral Altrex liner was impacted with good visualized rim fit.  The cup was positioned anatomically within the acetabular portion of the pelvis.      At this point, the femur was rolled at 80 degrees.  Further capsule was   released off the inferior aspect of the femoral neck.  I then   released the superior capsule proximally.  The hook was placed laterally   along the femur and elevated manually and held in position with the bed   hook.  The leg was then extended and adducted with the leg rolled to 100   degrees of external rotation.  Once the proximal femur was fully   exposed, I used a box osteotome to set orientation.  I then began   broaching with the starting chili pepper broach and passed this by hand and then broached up to 2.  With the 2 broach in place I chose a high offset neck and did a trial reduction.  The offset was appropriate, leg lengths   appeared to be equal, confirmed radiographically.   Given these findings, I went ahead and dislocated the hip, repositioned all   retractors and positioned the right hip in the extended and abducted position.  The final 2 Hi Tri Lock stem was   chosen and it was impacted down to the level of neck cut.  Based on this   and the trial reduction, a 36+1.5 delta ceramic ball was chosen and   impacted onto a clean and dry trunnion, and the hip was reduced.  The   hip had been irrigated throughout the case again at this point.  I did   reapproximate the superior capsular leaflet to the anterior leaflet   using #1 Vicryl.  The fascia of the   tensor fascia lata muscle was then reapproximated using #1 Vicryl and #0 V-lock.  The   remaining wound was closed with 2-0 Vicryl and running 4-0 Monocryl.   The hip was cleaned, dried, and dressed sterilely using Dermabond and   Aquacel dressing.  She was  then brought   to recovery room in stable  condition tolerating the procedure well.    Lanney Gins, PA-C was present for the entirety of the case involved from   preoperative positioning, perioperative retractor management, general   facilitation of the case, as well as primary wound closure as assistant.   Please dictated report for events during case           Madlyn Frankel. Charlann Boxer, M.D.        10/19/2013 10:34 AM

## 2013-10-19 NOTE — Progress Notes (Signed)
Utilization review completed.  

## 2013-10-19 NOTE — Progress Notes (Signed)
PACU note: Saline Bolus  500,s ok'd by Dr. Okey Dupre.

## 2013-10-19 NOTE — Anesthesia Preprocedure Evaluation (Signed)
Anesthesia Evaluation  Patient identified by MRN, date of birth, ID band Patient awake    Reviewed: Allergy & Precautions, H&P , NPO status , Patient's Chart, lab work & pertinent test results  Airway Mallampati: II TM Distance: >3 FB Neck ROM: Full    Dental no notable dental hx.    Pulmonary neg pulmonary ROS, former smoker,  breath sounds clear to auscultation  Pulmonary exam normal       Cardiovascular negative cardio ROS  Rhythm:Regular Rate:Normal     Neuro/Psych negative neurological ROS  negative psych ROS   GI/Hepatic Neg liver ROS, GERD-  Medicated,  Endo/Other  negative endocrine ROS  Renal/GU negative Renal ROS  negative genitourinary   Musculoskeletal negative musculoskeletal ROS (+)   Abdominal   Peds negative pediatric ROS (+)  Hematology negative hematology ROS (+)   Anesthesia Other Findings   Reproductive/Obstetrics negative OB ROS                           Anesthesia Physical Anesthesia Plan  ASA: II  Anesthesia Plan: Spinal   Post-op Pain Management:    Induction: Intravenous  Airway Management Planned: Simple Face Mask  Additional Equipment:   Intra-op Plan:   Post-operative Plan:   Informed Consent: I have reviewed the patients History and Physical, chart, labs and discussed the procedure including the risks, benefits and alternatives for the proposed anesthesia with the patient or authorized representative who has indicated his/her understanding and acceptance.   Dental advisory given  Plan Discussed with: CRNA and Surgeon  Anesthesia Plan Comments:         Anesthesia Quick Evaluation

## 2013-10-20 DIAGNOSIS — E663 Overweight: Secondary | ICD-10-CM

## 2013-10-20 DIAGNOSIS — D500 Iron deficiency anemia secondary to blood loss (chronic): Secondary | ICD-10-CM | POA: Diagnosis not present

## 2013-10-20 LAB — CBC
HCT: 26.8 % — ABNORMAL LOW (ref 36.0–46.0)
Hemoglobin: 9.4 g/dL — ABNORMAL LOW (ref 12.0–15.0)
MCH: 29.4 pg (ref 26.0–34.0)
MCHC: 35.1 g/dL (ref 30.0–36.0)
MCV: 83.8 fL (ref 78.0–100.0)
Platelets: 423 10*3/uL — ABNORMAL HIGH (ref 150–400)
RBC: 3.2 MIL/uL — ABNORMAL LOW (ref 3.87–5.11)
RDW: 15.5 % (ref 11.5–15.5)
WBC: 5.9 10*3/uL (ref 4.0–10.5)

## 2013-10-20 LAB — BASIC METABOLIC PANEL
BUN: 6 mg/dL (ref 6–23)
CO2: 23 mEq/L (ref 19–32)
Calcium: 7.8 mg/dL — ABNORMAL LOW (ref 8.4–10.5)
Chloride: 104 mEq/L (ref 96–112)
Creatinine, Ser: 0.66 mg/dL (ref 0.50–1.10)
GFR calc Af Amer: >90 mL/min (ref >90)
GFR calc non Af Amer: >90 mL/min (ref >90)
Glucose, Bld: 108 mg/dL — ABNORMAL HIGH (ref 70–99)
Potassium: 3.6 mEq/L (ref 3.5–5.1)
Sodium: 135 mEq/L (ref 135–145)

## 2013-10-20 MED ORDER — METHOCARBAMOL 500 MG PO TABS: 500.0000 mg | ORAL_TABLET | Freq: Four times a day (QID) | ORAL | Status: DC | PRN

## 2013-10-20 MED ORDER — ASPIRIN 325 MG PO TBEC: 325.0000 mg | DELAYED_RELEASE_TABLET | Freq: Two times a day (BID) | ORAL | Status: AC

## 2013-10-20 MED ORDER — FERROUS SULFATE 325 (65 FE) MG PO TABS: 325.0000 mg | ORAL_TABLET | Freq: Three times a day (TID) | ORAL | Status: DC

## 2013-10-20 MED ORDER — HYDROCODONE-ACETAMINOPHEN 7.5-325 MG PO TABS: 1.0000 | ORAL_TABLET | ORAL | Status: DC | PRN

## 2013-10-20 MED ORDER — POLYETHYLENE GLYCOL 3350 17 G PO PACK: 17.0000 g | PACK | Freq: Two times a day (BID) | ORAL | Status: DC

## 2013-10-20 MED ORDER — TRAMADOL HCL 50 MG PO TABS
50.0000 mg | ORAL_TABLET | Freq: Four times a day (QID) | ORAL | Status: DC | PRN
  Administered 2013-10-20 – 2013-10-21 (×4): 100 mg via ORAL
  Filled 2013-10-20 (×4): qty 2

## 2013-10-20 MED ORDER — SODIUM CHLORIDE 0.9 % IV BOLUS (SEPSIS)
250.0000 mL | Freq: Once | INTRAVENOUS | Status: AC
  Administered 2013-10-20: 250 mL via INTRAVENOUS

## 2013-10-20 MED ORDER — DSS 100 MG PO CAPS: 100.0000 mg | ORAL_CAPSULE | Freq: Two times a day (BID) | ORAL | Status: DC

## 2013-10-20 NOTE — Progress Notes (Signed)
   Subjective: 1 Day Post-Op Procedure(s) (LRB): LEFT TOTAL HIP ARTHROPLASTY ANTERIOR APPROACH; LEFT (Left)   Patient reports pain as mild, pain controlled with medication. Her BP has remained low throughout the night., but no events otherwise.   Objective:   VITALS:   Filed Vitals:   10/20/13 0930  BP: 109/71  Pulse: 92  Temp: 98 F (36.7 C)  Resp: 15    Neurovascular intact Dorsiflexion/Plantar flexion intact Incision: dressing C/D/I No cellulitis present Compartment soft  LABS  Recent Labs  10/19/13 1431 10/20/13 0521  HGB 10.9* 9.4*  HCT 31.3* 26.8*  WBC  --  5.9  PLT  --  423*     Recent Labs  10/20/13 0521  NA 135  K 3.6  BUN 6  CREATININE 0.66  GLUCOSE 108*     Assessment/Plan: 1 Day Post-Op Procedure(s) (LRB): LEFT TOTAL HIP ARTHROPLASTY ANTERIOR APPROACH; LEFT (Left) Foley cath d/c'ed Advance diet Up with therapy D/C IV fluids Discharge home with home health, if her pain is controlled and is not hypotensive Follow up in 2 weeks at [facility]. Follow up with OLIN,Sheetal Lyall D in 2 weeks.  Contact information:  [facility] [address] [place] [address] [phone]    Expected ABLA  Treated with iron and will observe  Overweight (BMI 25-29.9) Estimated body mass index is 29.85 kg/(m^2) as calculated from the following:   Height as of this encounter: 5\' 4"  (1.626 m).   Weight as of this encounter: 78.926 kg (174 lb). Patient also counseled that weight may inhibit the healing process Patient counseled that losing weight will help with future health issues      Anastasio Auerbach. Aniqa Hare   PAC  10/20/2013, 9:42 AM

## 2013-10-20 NOTE — Evaluation (Signed)
Occupational Therapy Evaluation Patient Details Name: Elyzabeth Goatley MRN: [id number] DOB: November 15, 1960 Today's Date: 10/20/2013 Time: 4098-1191 OT Time Calculation (min): 23 min  OT Assessment / Plan / Recommendation History of present illness THA   Clinical Impression   Pt presents to OT with decreased I with ADL activity s/p THA. Pt will benefit from skilled OT to address ADL activity to help pt return to PLOF    OT Assessment  Patient needs continued OT Services    Follow Up Recommendations  Home health OT             Frequency  Min 2X/week    Precautions / Restrictions Precautions Precautions: Fall Restrictions Weight Bearing Restrictions: No       ADL  Grooming: Set up Where Assessed - Grooming: Unsupported sitting Upper Body Bathing: Set up Where Assessed - Upper Body Bathing: Unsupported sitting Lower Body Bathing: Maximal assistance Where Assessed - Lower Body Bathing: Supported sit to stand Upper Body Dressing: Set up Where Assessed - Upper Body Dressing: Unsupported sitting Lower Body Dressing: Maximal assistance Where Assessed - Lower Body Dressing: Supported sit to stand ADL Comments: Pt performed sit to stand only with OT.      OT Diagnosis: Generalized weakness;Acute pain  OT Problem List: Decreased strength;Decreased activity tolerance;Pain OT Treatment Interventions: Self-care/ADL training;Patient/family education;DME and/or AE instruction   OT Goals(Current goals can be found in the care plan section) Acute Rehab OT Goals Patient Stated Goal: Resume previous lifestyle with decreased pain OT Goal Formulation: With patient Time For Goal Achievement: 11/03/13 Potential to Achieve Goals: Good  Visit Information  Last OT Received On: 10/20/13 Assistance Needed: +2 (pt orthostatic) History of Present Illness: THA       Prior Functioning     Home Living Family/patient expects to be discharged to:: Private residence Living Arrangements:  Alone Available Help at Discharge: Family Type of Home: House Home Access: Stairs to enter Secretary/administrator of Steps: 4 Entrance Stairs-Rails: Right Home Layout: One level Home Equipment: Environmental consultant - 2 wheels Prior Function Level of Independence: Independent Communication Communication: No difficulties Dominant Hand: Right         Vision/Perception Vision - History Patient Visual Report: No change from baseline   Cognition  Cognition Arousal/Alertness: Awake/alert Behavior During Therapy: WFL for tasks assessed/performed Overall Cognitive Status: Within Functional Limits for tasks assessed    Extremity/Trunk Assessment Upper Extremity Assessment Upper Extremity Assessment: Overall WFL for tasks assessed Lower Extremity Assessment Lower Extremity Assessment: LLE deficits/detail LLE Deficits / Details: Hip strength 2/5 with AROM at hip to 45 flex and 20 abd     Mobility Bed Mobility  Sit to Stand: 3: Mod assist;From chair/3-in-1  Stand to Sit: 3: Mod assist;To chair/3-in-1  Details for Transfer Assistance: pt stood for approx 4 min prior to sitting. BP 104/71  Sit to stand only          End of Session OT - End of Session Activity Tolerance: Patient tolerated treatment well Patient left: in bed;with family/visitor present  GO     Sparkles Mcneely, Metro Kung 10/20/2013, 1:20 PM

## 2013-10-20 NOTE — Op Note (Signed)
[name], [name] [name]             ACCOUNT NO.:  [id number]  MEDICAL RECORD NO.:  [id number]  LOCATION:  1618                         FACILITY:  [facility]  PHYSICIAN:  Madlyn Frankel. Charlann Boxer, M.D.  DATE OF BIRTH:  01-28-1961  DATE OF PROCEDURE:  10/19/2013 DATE OF DISCHARGE:                              OPERATIVE REPORT   ADDENDUM:  This addendum is being reported based on intraoperative events of bradycardia.  During the procedure at the time of preparation of acetabulum, she was noted to have bradycardia down into a heart rate of about 30.  At that time, she had received some atropine as well as epinephrine.  Anesthesia asked that some chest compressions be done during this time.  Following the administration of medication which was a period of time of less than a minute or 2, she regained full normal heart rate and blood pressure.  She was noted under spinal block anesthesia to be communicating with the anesthesia after the event indicating perhaps just a brady event for her.  After speaking with Dr. Okey Dupre, anesthesiologist in charge of the case, he felt that perhaps it was either related to a small spinal influence or more importantly perhaps some CO2 retention causing the brady event.  Following the event, the procedure went without any further issues or complications with better respirations and CO2 clearance.  These findings were reviewed with her family and after the operation, Dr. Okey Dupre is going to talk to the patient as well.     Madlyn Frankel Charlann Boxer, M.D.     MDO/MEDQ  D:  10/19/2013  T:  10/20/2013  Job:  [id number]

## 2013-10-20 NOTE — Evaluation (Signed)
Physical Therapy Evaluation Patient Details Name: [name] [name] MRN: [id number] DOB: 05-03-1961 Today's Date: 10/20/2013 Time: 4098-1191 PT Time Calculation (min): 42 min  PT Assessment / Plan / Recommendation History of Present Illness  THA  Clinical Impression  Pt s/p L THR presents with decreased L LE strength/ROM, post op pain and orthostatic hypotension limiting functional mobility.  Pt should progress to d/c home with family assist and HHPT    PT Assessment  Patient needs continued PT services    Follow Up Recommendations  Home health PT    Does the patient have the potential to tolerate intense rehabilitation      Barriers to Discharge        Equipment Recommendations  Rolling walker with 5" wheels    Recommendations for Other Services OT consult   Frequency 7X/week    Precautions / Restrictions Precautions Precautions: Fall Restrictions Weight Bearing Restrictions: No   Pertinent Vitals/Pain 5/10; premed, ice pack provided.  BP sitting 107/73, stand 96/63 and declined to 85/52 after transfer to chair.  RN aware      Mobility  Bed Mobility Bed Mobility: Supine to Sit Supine to Sit: 3: Mod assist Details for Bed Mobility Assistance: cues for sequence and use of R LE to self assist Transfers Transfers: Sit to Stand;Stand to Sit Sit to Stand: 3: Mod assist;From chair/3-in-1 Sit to Stand: Patient Percentage: 80% Stand to Sit: 3: Mod assist;To chair/3-in-1 Stand to Sit: Patient Percentage: 70% Details for Transfer Assistance: pt stood for approx 4 min prior to sitting. BP 104/71 Ambulation/Gait Ambulation/Gait Assistance: 1: +2 Total assist Ambulation/Gait: Patient Percentage: 80% Ambulation Distance (Feet): 3 Feet Assistive device: Rolling walker Ambulation/Gait Assistance Details: cues for sequence, posture, and position from RW Gait Pattern: Step-to pattern;Decreased step length - right;Decreased step length - left;Shuffle;Trunk  flexed;Antalgic General Gait Details: ltd by declining BP and c/o dizziness and feeling flushed    Exercises Total Joint Exercises Ankle Circles/Pumps: AROM;15 reps;Supine;Both Quad Sets: AROM;Both;10 reps;Supine Heel Slides: AAROM;15 reps;Supine;Left Hip ABduction/ADduction: AAROM;Left;15 reps;Supine   PT Diagnosis: Difficulty walking  PT Problem List: Decreased strength;Decreased range of motion;Decreased activity tolerance;Decreased mobility;Decreased knowledge of use of DME;Pain PT Treatment Interventions: DME instruction;Gait training;Stair training;Functional mobility training;Therapeutic activities;Therapeutic exercise;Patient/family education     PT Goals(Current goals can be found in the care plan section) Acute Rehab PT Goals Patient Stated Goal: Resume previous lifestyle with decreased pain PT Goal Formulation: With patient Time For Goal Achievement: 10/27/13 Potential to Achieve Goals: Good  Visit Information  Last PT Received On: 10/20/13 Assistance Needed: +2 (pt orthostatic) History of Present Illness: THA       Prior Functioning  Home Living Family/patient expects to be discharged to:: Private residence Living Arrangements: Alone Available Help at Discharge: Family Type of Home: House Home Access: Stairs to enter Secretary/administrator of Steps: 4 Entrance Stairs-Rails: Right Home Layout: One level Home Equipment: Environmental consultant - 2 wheels Prior Function Level of Independence: Independent Communication Communication: No difficulties Dominant Hand: Right    Cognition  Cognition Arousal/Alertness: Awake/alert Behavior During Therapy: WFL for tasks assessed/performed Overall Cognitive Status: Within Functional Limits for tasks assessed    Extremity/Trunk Assessment Upper Extremity Assessment Upper Extremity Assessment: Overall WFL for tasks assessed Lower Extremity Assessment Lower Extremity Assessment: LLE deficits/detail LLE Deficits / Details: Hip  strength 2/5 with AROM at hip to 45 flex and 20 abd   Balance    End of Session PT - End of Session Equipment Utilized During Treatment: Gait belt Activity Tolerance: Other (comment)  Patient left: in chair;with call bell/phone within reach;with family/visitor present Nurse Communication: Mobility status  GP     Javel Hersh 10/20/2013, 1:18 PM

## 2013-10-20 NOTE — Progress Notes (Signed)
Physical Therapy Treatment Patient Details Name: Mareli Antunes MRN: [id number] DOB: 01-24-1961 Today's Date: 10/20/2013 Time: 5784-6962 PT Time Calculation (min): 32 min  PT Assessment / Plan / Recommendation  History of Present Illness THA   PT Comments   Marked improvement in pt's activity tolerance this pm.  BP in recliner 117/70, sitting 111/70, standing 105/71, ambulating 118/59 and on return to chair 109/57.  Follow Up Recommendations  Home health PT     Does the patient have the potential to tolerate intense rehabilitation     Barriers to Discharge        Equipment Recommendations  Rolling walker with 5" wheels    Recommendations for Other Services OT consult  Frequency 7X/week   Progress towards PT Goals Progress towards PT goals: Progressing toward goals  Plan Current plan remains appropriate    Precautions / Restrictions Precautions Precautions: Fall Restrictions Weight Bearing Restrictions: No   Pertinent Vitals/Pain 3/10; premed, ice pack provided    Mobility  Transfers Transfers: Sit to Stand;Stand to Sit Sit to Stand: 4: Min assist Stand to Sit: 4: Min assist Ambulation/Gait Ambulation/Gait Assistance: 1: +2 Total assist Ambulation/Gait: Patient Percentage: 80% Ambulation Distance (Feet): 222 Feet Assistive device: Rolling walker Ambulation/Gait Assistance Details: cues for initial sequence, ER on R, stride length, posture and progressing to recip gait Gait Pattern: Step-to pattern;Decreased step length - right;Decreased step length - left;Shuffle;Trunk flexed;Antalgic;Step-through pattern    Exercises     PT Diagnosis:    PT Problem List:   PT Treatment Interventions:     PT Goals (current goals can now be found in the care plan section) Acute Rehab PT Goals Patient Stated Goal: Resume previous lifestyle with decreased pain PT Goal Formulation: With patient Time For Goal Achievement: 10/27/13 Potential to Achieve Goals: Good  Visit  Information  Last PT Received On: 10/20/13 Assistance Needed: +2 (saftey 2* orthostasis this am) History of Present Illness: THA    Subjective Data  Patient Stated Goal: Resume previous lifestyle with decreased pain   Cognition  Cognition Arousal/Alertness: Awake/alert Behavior During Therapy: WFL for tasks assessed/performed Overall Cognitive Status: Within Functional Limits for tasks assessed    Balance     End of Session PT - End of Session Equipment Utilized During Treatment: Gait belt Activity Tolerance: Patient tolerated treatment well Patient left: in chair;with call bell/phone within reach;with family/visitor present Nurse Communication: Mobility status   GP     Anjolina Byrer 10/20/2013, 5:19 PM

## 2013-10-21 LAB — CBC
HCT: 24.7 % — ABNORMAL LOW (ref 36.0–46.0)
Hemoglobin: 8.4 g/dL — ABNORMAL LOW (ref 12.0–15.0)
MCH: 29 pg (ref 26.0–34.0)
MCHC: 34 g/dL (ref 30.0–36.0)
MCV: 85.2 fL (ref 78.0–100.0)
Platelets: 391 10*3/uL (ref 150–400)
RBC: 2.9 MIL/uL — ABNORMAL LOW (ref 3.87–5.11)
RDW: 15.7 % — ABNORMAL HIGH (ref 11.5–15.5)
WBC: 5 10*3/uL (ref 4.0–10.5)

## 2013-10-21 LAB — BASIC METABOLIC PANEL
BUN: 6 mg/dL (ref 6–23)
CO2: 25 mEq/L (ref 19–32)
Calcium: 7.6 mg/dL — ABNORMAL LOW (ref 8.4–10.5)
Chloride: 104 mEq/L (ref 96–112)
Creatinine, Ser: 0.62 mg/dL (ref 0.50–1.10)
GFR calc Af Amer: >90 mL/min (ref >90)
GFR calc non Af Amer: >90 mL/min (ref >90)
Glucose, Bld: 102 mg/dL — ABNORMAL HIGH (ref 70–99)
Potassium: 3.6 mEq/L (ref 3.5–5.1)
Sodium: 137 mEq/L (ref 135–145)

## 2013-10-21 MED ORDER — HYDROCODONE-ACETAMINOPHEN 7.5-325 MG PO TABS: 1.0000 | ORAL_TABLET | ORAL | Status: DC | PRN

## 2013-10-21 MED ORDER — TRAMADOL HCL 50 MG PO TABS: 50.0000 mg | ORAL_TABLET | Freq: Four times a day (QID) | ORAL | Status: DC | PRN

## 2013-10-21 MED ORDER — METHOCARBAMOL 500 MG PO TABS: 500.0000 mg | ORAL_TABLET | Freq: Four times a day (QID) | ORAL | Status: DC | PRN

## 2013-10-21 NOTE — Care Management Note (Signed)
    Page 1 of 1   10/21/2013     3:02:46 PM   CARE MANAGEMENT NOTE 10/21/2013  Patient:  [name] [name]   Account Number:  [id number]  Date Initiated:  10/21/2013  Documentation initiated by:  [name] [name]  Subjective/Objective Assessment:   dx Left anmterior hip replacemnt    Pre-arranged with [name] [name] to provide [facility] services day after discharge.     Action/Plan:   Home with HH services. Family will provide care. No DME needs   Anticipated DC Date:  10/21/2013   Anticipated DC Plan:  HOME W HOME HEALTH SERVICES      DC Planning Services  CM consult      [facility] Choice  HOME HEALTH   Choice offered to / List presented to:          [facility] arranged  HH-2 PT      [facility] agency  [facility]   Status of service:  Completed, signed off Medicare Important Message given?   (If response is "NO", the following Medicare IM given date fields will be blank) Date Medicare IM given:   Date Additional Medicare IM given:    Discharge Disposition:  HOME W HOME HEALTH SERVICES  Per UR Regulation:    If discussed at Long Length of Stay Meetings, dates discussed:    Comments:

## 2013-10-21 NOTE — Progress Notes (Signed)
Physical Therapy Treatment Patient Details Name: [name] [name] MRN: [id number] DOB: 1961-09-12 Today's Date: 10/21/2013 Time: 1126-1200 PT Time Calculation (min): 34 min  PT Assessment / Plan / Recommendation  History of Present Illness THA   PT Comments     Follow Up Recommendations  Home health PT     Does the patient have the potential to tolerate intense rehabilitation     Barriers to Discharge        Equipment Recommendations       Recommendations for Other Services OT consult  Frequency 7X/week   Progress towards PT Goals Progress towards PT goals: Progressing toward goals  Plan Current plan remains appropriate    Precautions / Restrictions Precautions Precautions: Fall Restrictions Weight Bearing Restrictions: No   Pertinent Vitals/Pain 3/10; premed, ice packs provided    Mobility  Transfers Transfers: Sit to Stand;Stand to Sit Sit to Stand: 5: Supervision Stand to Sit: 5: Supervision Details for Transfer Assistance: cues for LE management and use of UEs to self assist Ambulation/Gait Ambulation/Gait Assistance: 4: Min guard;5: Supervision Ambulation Distance (Feet): 200 Feet (twice) Assistive device: Rolling walker Ambulation/Gait Assistance Details: cues for initial sequence, stride length, posture, positioin from RW and progression to recip gait Gait Pattern: Step-to pattern;Step-through pattern;Decreased step length - right;Decreased step length - left;Shuffle;Antalgic Stairs: Yes Stairs Assistance: 4: Min assist Stairs Assistance Details (indicate cue type and reason): cues for sequence and foot/cane placment Stair Management Technique: One rail Right;Step to pattern;Forwards;With cane Number of Stairs: 8    Exercises     PT Diagnosis:    PT Problem List:   PT Treatment Interventions:     PT Goals (current goals can now be found in the care plan section) Acute Rehab PT Goals Patient Stated Goal: Resume previous lifestyle with decreased  pain PT Goal Formulation: With patient Time For Goal Achievement: 10/27/13 Potential to Achieve Goals: Good  Visit Information  Last PT Received On: 10/21/13 Assistance Needed: +1 History of Present Illness: THA    Subjective Data  Subjective: Doing better than yesterday Patient Stated Goal: Resume previous lifestyle with decreased pain   Cognition  Cognition Arousal/Alertness: Awake/alert Behavior During Therapy: WFL for tasks assessed/performed Overall Cognitive Status: Within Functional Limits for tasks assessed    Balance     End of Session PT - End of Session Equipment Utilized During Treatment: Gait belt Activity Tolerance: Patient tolerated treatment well Patient left: in chair;with call bell/phone within reach;with family/visitor present Nurse Communication: Mobility status   GP     [name] [name] 10/21/2013, 12:55 PM

## 2013-10-21 NOTE — Progress Notes (Signed)
Physical Therapy Treatment Patient Details Name: [name] [name] MRN: [id number] DOB: 05-24-1961 Today's Date: 10/21/2013 Time: 1212-1236 PT Time Calculation (min): 24 min  PT Assessment / Plan / Recommendation  History of Present Illness THA   PT Comments   Reviewed stairs, and car transfers with pt and caregiver.    Follow Up Recommendations  Home health PT     Does the patient have the potential to tolerate intense rehabilitation     Barriers to Discharge        Equipment Recommendations       Recommendations for Other Services OT consult  Frequency 7X/week   Progress towards PT Goals Progress towards PT goals: Progressing toward goals  Plan Current plan remains appropriate    Precautions / Restrictions Precautions Precautions: Fall Restrictions Weight Bearing Restrictions: No   Pertinent Vitals/Pain 3/10    Mobility  Bed Mobility Bed Mobility: Supine to Sit;Sit to Supine Supine to Sit: 4: Min guard Sit to Supine: 4: Min guard Details for Bed Mobility Assistance: cues for sequence and use of R LE to self assist Transfers Transfers: Sit to Stand;Stand to Sit Sit to Stand: 5: Supervision Stand to Sit: 5: Supervision Details for Transfer Assistance: cues for LE management and use of UEs to self assist Ambulation/Gait Ambulation/Gait Assistance: 5: Supervision Ambulation Distance (Feet): 10 Feet Assistive device: Rolling walker Ambulation/Gait Assistance Details: min cues for posture and position from RW Gait Pattern: Step-to pattern;Step-through pattern;Decreased step length - right;Decreased step length - left;Shuffle;Antalgic Stairs: Yes Stairs Assistance: 4: Min assist Stairs Assistance Details (indicate cue type and reason): bkwds for use of stool into high vehicle and high bed Stair Management Technique: No rails;Step to pattern;Backwards;With walker Number of Stairs: 1    Exercises Total Joint Exercises Ankle Circles/Pumps: AROM;15  reps;Supine;Both Quad Sets: AROM;Both;10 reps;Supine Gluteal Sets: AROM;Both;10 reps;Supine Heel Slides: AAROM;Supine;Left;20 reps Hip ABduction/ADduction: AAROM;Left;Supine;20 reps   PT Diagnosis:    PT Problem List:   PT Treatment Interventions:     PT Goals (current goals can now be found in the care plan section) Acute Rehab PT Goals Patient Stated Goal: Resume previous lifestyle with decreased pain PT Goal Formulation: With patient Time For Goal Achievement: 10/27/13 Potential to Achieve Goals: Good  Visit Information  Last PT Received On: 10/21/13 Assistance Needed: +1 History of Present Illness: THA    Subjective Data  Subjective: Doing better than yesterday Patient Stated Goal: Resume previous lifestyle with decreased pain   Cognition  Cognition Arousal/Alertness: Awake/alert Behavior During Therapy: WFL for tasks assessed/performed Overall Cognitive Status: Within Functional Limits for tasks assessed    Balance     End of Session PT - End of Session Equipment Utilized During Treatment: Gait belt Activity Tolerance: Patient tolerated treatment well Patient left: in chair;with call bell/phone within reach;with family/visitor present Nurse Communication: Mobility status   GP     [name] [name] 10/21/2013, 1:01 PM

## 2013-10-21 NOTE — Progress Notes (Signed)
Pt to d/c home with [place] home health. No DME needs. Pt medicated for pain at d/c. AVS reviewed and "My Chart" discussed with pt. Pt capable of verbalizing medications and follow-up appointments. Remains hemodynamically stable. No signs and symptoms of distress. Educated pt to return to ER in the case of SOB, dizziness, or chest pain.

## 2013-10-21 NOTE — Progress Notes (Signed)
Occupational Therapy Treatment Patient Details Name: [name] [name] MRN: [id number] DOB: 04-03-1961 Today's Date: 10/21/2013 Time: 4098-1191 OT Time Calculation (min): 50 min  OT Assessment / Plan / Recommendation  History of present illness THA   OT comments  Pt ready for DC from OT standpoint. Education complete with pt and friend  Follow Up Recommendations  Home health OT       Equipment Recommendations  None recommended by OT       Frequency Min 2X/week   Progress towards OT Goals Progress towards OT goals: Progressing toward goals  Plan Discharge plan remains appropriate    Precautions / Restrictions Precautions Precautions: None Restrictions Weight Bearing Restrictions: No       ADL  Upper Body Bathing: Set up Where Assessed - Upper Body Bathing: Unsupported sitting Lower Body Bathing: Minimal assistance Where Assessed - Lower Body Bathing: Unsupported sit to stand Upper Body Dressing: Set up Where Assessed - Upper Body Dressing: Unsupported sitting Lower Body Dressing: Minimal assistance Where Assessed - Lower Body Dressing: Unsupported sit to stand Toilet Transfer Method: Sit to stand Toilet Transfer Equipment: Comfort height toilet Toileting - Clothing Manipulation and Hygiene: Min guard Where Assessed - Toileting Clothing Manipulation and Hygiene: Standing;Sit to stand from 3-in-1 or toilet Tub/Shower Transfer: Supervision/safety Tub/Shower Transfer Method: Science writer: Shower seat with back Equipment Used: Ship broker ADL Comments: educated on use of Ae and dressing techniques        Visit Information  Last OT Received On: 10/21/13 Assistance Needed: +1 History of Present Illness: THA          Cognition  Cognition Arousal/Alertness: Awake/alert Behavior During Therapy: WFL for tasks assessed/performed Overall Cognitive Status: Within Functional Limits for tasks assessed    Mobility  Transfers Transfers:  Sit to Stand;Stand to Sit Sit to Stand: From chair/3-in-1;From toilet;5: Supervision;With upper extremity assist;Other (comment) (shower) Stand to Sit: To chair/3-in-1;To toilet;5: Supervision;With upper extremity assist          End of Session OT - End of Session Equipment Utilized During Treatment: Rolling walker Activity Tolerance: Patient tolerated treatment well Patient left: in chair;with family/visitor present;with call bell/phone within reach Nurse Communication: Mobility status  GO     Alba Cory 10/21/2013, 11:07 AM

## 2013-10-21 NOTE — Progress Notes (Signed)
Occupational Therapy Treatment Patient Details Name: Amelita Risinger MRN: [id number] DOB: 19-Nov-1960 Today's Date: 10/21/2013 Time: 9629-5284 OT Time Calculation (min): 23 min  OT Assessment / Plan / Recommendation  History of present illness THA   OT comments  Much improved this OT visit!  Follow Up Recommendations  Home health OT       Equipment Recommendations  None recommended by OT          Progress towards OT Goals Progress towards OT goals: Progressing toward goals  Plan Discharge plan remains appropriate    Precautions / Restrictions Precautions Precautions: None Restrictions Weight Bearing Restrictions: No       ADL  Toilet Transfer Method: Sit to stand Toilet Transfer Equipment: Comfort height toilet Toileting - Clothing Manipulation and Hygiene: Min guard Where Assessed - Toileting Clothing Manipulation and Hygiene: Standing;Sit to stand from 3-in-1 or toilet      OT Goals(current goals can now be found in the care plan section)    Visit Information  Last OT Received On: 10/21/13 Assistance Needed: +1 History of Present Illness: THA          Cognition  Cognition Arousal/Alertness: Awake/alert Behavior During Therapy: WFL for tasks assessed/performed Overall Cognitive Status: Within Functional Limits for tasks assessed    Mobility  Transfers Transfers: Sit to Stand;Stand to Sit Sit to Stand: 4: Min guard;From chair/3-in-1;From toilet Stand to Sit: 4: Min guard;To chair/3-in-1;To toilet          End of Session OT - End of Session Activity Tolerance: Patient tolerated treatment well Patient left: in chair  GO     Chasady Longwell, [place] Kung 10/21/2013, 10:56 AM

## 2013-10-21 NOTE — Progress Notes (Signed)
   Subjective: 2 Days Post-Op Procedure(s) (LRB): LEFT TOTAL HIP ARTHROPLASTY ANTERIOR APPROACH; LEFT (Left)   Patient reports pain as mild, pain controlled. No events throughout the night. Feels that she turned a corner late yesterday and feels much better now. Ready to be discharged home.  Objective:   VITALS:   Filed Vitals:   10/21/13  BP: 110/68  Pulse: 83  Temp: 98.3 F (36.8 C)   Resp: 16    Neurovascular intact Dorsiflexion/Plantar flexion intact Incision: dressing C/D/I No cellulitis present Compartment soft  LABS  Recent Labs  10/19/13 1431 10/20/13 0521 10/21/13 0522  HGB 10.9* 9.4* 8.4*  HCT 31.3* 26.8* 24.7*  WBC  --  5.9 5.0  PLT  --  423* 391     Recent Labs  10/20/13 0521 10/21/13 0522  NA 135 137  K 3.6 3.6  BUN 6 6  CREATININE 0.66 0.62  GLUCOSE 108* 102*     Assessment/Plan: 2 Days Post-Op Procedure(s) (LRB): LEFT TOTAL HIP ARTHROPLASTY ANTERIOR APPROACH; LEFT (Left) Up with therapy Discharge home with home health Follow up in 2 weeks at [facility]. Follow up with OLIN,Ramonita Koenig D in 2 weeks.  Contact information:  [facility] [address] [place] [address] [phone]    Expected ABLA  Treated with iron and will observe   Overweight (BMI 25-29.9)  Estimated body mass index is 29.85 kg/(m^2) as calculated from the following:      Height as of this encounter: 5\' 4"  (1.626 m).      Weight as of this encounter: 78.926 kg (174 lb).  Patient also counseled that weight may inhibit the healing process  Patient counseled that losing weight will help with future health issues       Anastasio Auerbach. Samyria Rudie   PAC  10/21/2013, 9:04 AM

## 2013-10-24 NOTE — Discharge Summary (Signed)
Physician Discharge Summary  Patient ID: Deniss Wormley MRN: [id number] DOB/AGE: [birth year] 51 y.o.  Admit date: 10/19/2013 Discharge date: 10/21/2013   Procedures:  Procedure(s) (LRB): LEFT TOTAL HIP ARTHROPLASTY ANTERIOR APPROACH; LEFT (Left)  Attending Physician:  Dr. Durene Romans   Admission Diagnoses:   Left hip OA / pain  Discharge Diagnoses:  Principal Problem:   S/P left THA, AA Active Problems:   Expected blood loss anemia   Overweight (BMI 25.0-29.9)  Past Medical History  Diagnosis Date  . GERD (gastroesophageal reflux disease)   . H/O seasonal allergies   . Arthritis     osteoarthritis- hips/back  . Sinus drainage 10/18/13    HPI: [name] [name], 52 y.o. female, has a history of pain and functional disability in the left hip(s) due to arthritis and patient has failed non-surgical conservative treatments for greater than 12 weeks to include NSAID's and/or analgesics and activity modification. Onset of symptoms was abrupt starting 6+ years ago with gradually worsening course since that time.The patient noted no past surgery on the left hip(s). Patient currently rates pain in the left hip at 7 out of 10 with activity. Patient has worsening of pain with activity and weight bearing, trendelenberg gait, pain that interfers with activities of daily living and pain with passive range of motion. Patient has evidence of periarticular osteophytes and joint space narrowing by imaging studies. This condition presents safety issues increasing the risk of falls. There is no current active infection. Risks, benefits and expectations were discussed with the patient. Risks including but not limited to the risk of anesthesia, blood clots, nerve damage, blood vessel damage, failure of the prosthesis, infection and up to and including death. Patient understand the risks, benefits and expectations and wishes to proceed with surgery.  PCP: Eartha Inch, MD   Discharged Condition:  good  Hospital Course:  Patient underwent the above stated procedure on 10/19/2013. Patient tolerated the procedure well and brought to the recovery room in good condition and subsequently to the floor.  POD #1 BP: 109/71 ; Pulse: 92 ; Temp: 98 F (36.7 C) ; Resp: 15  Pt's foley was removed. IV maintained to continue receiving fluid to help maintain BP. Patient reports pain as mild, pain controlled with medication. Her BP has remained low throughout the night., but no events otherwise.  Neurovascular intact, dorsiflexion/plantar flexion intact, incision: dressing C/D/I, no cellulitis present and compartment soft.   LABS  Basename    HGB  9.4  HCT  26.8   POD #2  BP: 110/68 ; Pulse: 83 ; Temp: 98.3 F (36.8 C) ; Resp: 16  Patient reports pain as mild, pain controlled. No events throughout the night. Feels that she turned a corner late yesterday and feels much better now. Ready to be discharged home.  Neurovascular intact, dorsiflexion/plantar flexion intact, incision: dressing C/D/I, no cellulitis present and compartment soft.   LABS  Basename    HGB  8.4  HCT  24.7    Discharge Exam: General appearance: alert, cooperative and no distress Extremities: Homans sign is negative, no sign of DVT, no edema, redness or tenderness in the calves or thighs and no ulcers, gangrene or trophic changes  Disposition:    Home-Health Care Svc with follow up in 2 weeks   Follow-up Information   Follow up with Shelda Pal, MD. Schedule an appointment as soon as possible for a visit in 2 weeks.   Specialty:  Orthopedic Surgery   Contact information:   [address]  [address] [place] [address] [phone]       Discharge Orders   Future Orders Complete By Expires   Call MD / Call 911  As directed    Comments:     If you experience chest pain or shortness of breath, CALL 911 and be transported to the hospital emergency room.  If you develope a fever above 101 F, pus (white  drainage) or increased drainage or redness at the wound, or calf pain, call your surgeon's office.   Change dressing  As directed    Comments:     Maintain surgical dressing for 10-14 days, then replace with 4x4 guaze and tape. Keep the area dry and clean.   Constipation Prevention  As directed    Comments:     Drink plenty of fluids.  Prune juice may be helpful.  You may use a stool softener, such as Colace (over the counter) 100 mg twice a day.  Use MiraLax (over the counter) for constipation as needed.   Diet - low sodium heart healthy  As directed    Discharge instructions  As directed    Comments:     Maintain surgical dressing for 10-14 days, then replace with gauze and tape. Keep the area dry and clean until follow up. Follow up in 2 weeks at [facility] Call with any questions or concerns.   Increase activity slowly as tolerated  As directed    TED hose  As directed    Comments:     Use stockings (TED hose) for 2 weeks on both leg(s).  You may remove them at night for sleeping.   Weight bearing as tolerated  As directed    Questions:     Laterality:     Extremity:          Medication List    STOP taking these medications       acetaminophen 500 MG tablet  Commonly known as:  TYLENOL     ibuprofen 200 MG tablet  Commonly known as:  ADVIL,MOTRIN     meloxicam 7.5 MG tablet  Commonly known as:  MOBIC      TAKE these medications       aspirin 325 MG EC tablet  Take 1 tablet (325 mg total) by mouth 2 (two) times daily.     cycloSPORINE 0.05 % ophthalmic emulsion  Commonly known as:  RESTASIS  1 drop 2 (two) times daily.     diphenhydrAMINE 25 MG tablet  Commonly known as:  BENADRYL  Take 25 mg by mouth every 6 (six) hours as needed (sinus drainage).     DSS 100 MG Caps  Take 100 mg by mouth 2 (two) times daily.     ferrous sulfate 325 (65 FE) MG tablet  Take 1 tablet (325 mg total) by mouth 3 (three) times daily after meals.     fluticasone 50  MCG/ACT nasal spray  Commonly known as:  FLONASE  Place 1 spray into the nose daily. Use 1 squirt to each nostril daily as needed for sinus symptoms     HYDROcodone-acetaminophen 7.5-325 MG per tablet  Commonly known as:  NORCO  Take 1-2 tablets by mouth every 4 (four) hours as needed for moderate pain.     methocarbamol 500 MG tablet  Commonly known as:  ROBAXIN  Take 1 tablet (500 mg total) by mouth every 6 (six) hours as needed for muscle spasms.     metronidazole 1 % cream  Commonly known  as:  NORITATE  Apply 1 application topically daily.     omeprazole 40 MG capsule  Commonly known as:  PRILOSEC  Take 40 mg by mouth daily. Take one capsule by mouth one time daily     polyethylene glycol packet  Commonly known as:  MIRALAX / GLYCOLAX  Take 17 g by mouth 2 (two) times daily.     traMADol 50 MG tablet  Commonly known as:  ULTRAM  Take 1-2 tablets (50-100 mg total) by mouth every 6 (six) hours as needed.         Signed: Anastasio Auerbach. Marico Buckle   PAC  10/24/2013, 7:07 PM

## 2014-03-08 ENCOUNTER — Encounter: Payer: Self-pay | Admitting: Internal Medicine

## 2014-03-31 ENCOUNTER — Telehealth: Payer: Self-pay

## 2014-03-31 NOTE — Telephone Encounter (Signed)
I was contacted by the patient's primary care office asking if she can be seen prior to August.  I have contacted the patient and she declines to schedule with APP she would prefer to wait to see Dr. Hilarie Fredrickson.  She is aware that he does not have any current openings that will work with her schedule and she will call back in the next few weeks to set up an appt when the August schedule come out

## 2014-05-04 ENCOUNTER — Ambulatory Visit: Payer: Managed Care, Other (non HMO) | Admitting: Internal Medicine

## 2014-08-10 ENCOUNTER — Encounter: Payer: Self-pay | Admitting: Nurse Practitioner

## 2014-08-10 ENCOUNTER — Ambulatory Visit (INDEPENDENT_AMBULATORY_CARE_PROVIDER_SITE_OTHER): Payer: Managed Care, Other (non HMO) | Admitting: Nurse Practitioner

## 2014-08-10 VITALS — BP 118/74 | HR 74 | Ht 64.0 in | Wt 179.0 lb

## 2014-08-10 DIAGNOSIS — D509 Iron deficiency anemia, unspecified: Secondary | ICD-10-CM

## 2014-08-10 DIAGNOSIS — R1013 Epigastric pain: Secondary | ICD-10-CM

## 2014-08-10 DIAGNOSIS — G8929 Other chronic pain: Secondary | ICD-10-CM

## 2014-08-10 NOTE — Patient Instructions (Signed)

## 2014-08-11 ENCOUNTER — Encounter: Payer: Self-pay | Admitting: Nurse Practitioner

## 2014-08-11 DIAGNOSIS — R1013 Epigastric pain: Secondary | ICD-10-CM | POA: Insufficient documentation

## 2014-08-11 DIAGNOSIS — D509 Iron deficiency anemia, unspecified: Secondary | ICD-10-CM | POA: Insufficient documentation

## 2014-08-11 NOTE — Progress Notes (Addendum)
HPI :   Patient is a 53 year old female known remotely to Dr. Sharlett Iles. She had an upper endoscopy for chest pain in 2006, findings included a 2 cm hiatal hernia, otherwise normal exam. Patient is referred by Joni Reining, PA for evaluation of nonradiating, upper abdominal pain. She has had intermittent upper abdominal pain for years but it got worse in the spring (after running out of PPI )and has really progressed over the last few weeks. Pain can last for hours at a time, it is not really related to meals, it is worse in the a.m before breakfast. No associated nausea. Patient has been on a daily PPI for years. Patient has arthritis. She had a left total hip arthroplasty December 2014. A few months back she was takings taken Ibuprofen and / or Mobic but is not taking either at present. Her bowel movements are normal. No unusual weight loss.  Patient recently diagnosed with anemia. She has fibroids and has been experiencing increased vaginal bleeding. Hemoglobin 15 a year ago, in 9 range several months ago and in 9 range now. She has been started on iron. No gastrointestinal bleeding. Patient is supposed to see a hematologist for elevated platelet count  Past Medical History  Diagnosis Date  . GERD (gastroesophageal reflux disease)   . H/O seasonal allergies   . Arthritis     osteoarthritis- hips/back  . Sinus drainage 10/18/13  . Hiatal hernia 04/01/2005    Dr. Sharlett Iles     Family History  Problem Relation Age of Onset  . Cancer Mother     breast   History  Substance Use Topics  . Smoking status: Former Smoker    Quit date: 09/21/1985  . Smokeless tobacco: Never Used  . Alcohol Use: Yes     Comment: 1-2  glasses daily   Current Outpatient Prescriptions  Medication Sig Dispense Refill  . cetirizine (ZYRTEC) 10 MG tablet Take 10 mg by mouth daily.      . cycloSPORINE (RESTASIS) 0.05 % ophthalmic emulsion 1 drop 2 (two) times daily.      . diphenhydrAMINE (BENADRYL) 25  MG tablet Take 25 mg by mouth every 6 (six) hours as needed (sinus drainage).      . ferrous sulfate 325 (65 FE) MG tablet Take 1 tablet (325 mg total) by mouth 3 (three) times daily after meals.    3  . fluticasone (FLONASE) 50 MCG/ACT nasal spray Place 1 spray into the nose daily. Use 1 squirt to each nostril daily as needed for sinus symptoms      . ibuprofen (ADVIL,MOTRIN) 200 MG tablet Take 200 mg by mouth as needed.      . metronidazole (NORITATE) 1 % cream Apply 1 application topically daily.      Marland Kitchen omeprazole (PRILOSEC) 40 MG capsule Take 40 mg by mouth daily. Take one capsule by mouth one time daily      . pseudoephedrine-guaifenesin (MUCINEX D) 60-600 MG per tablet Take 1 tablet by mouth every 12 (twelve) hours.       No current facility-administered medications for this visit.   Allergies  Allergen Reactions  . Nitrofurantoin Hives   Review of Systems: All systems reviewed and negative except where noted in HPI.   Physical Exam: BP 118/74  Pulse 74  Ht 5\' 4"  (1.626 m)  Wt 179 lb (81.194 kg)  BMI 30.71 kg/m2 Constitutional: Pleasant,well-developed, white female in no acute distress. HEENT: Normocephalic and atraumatic. Conjunctivae are normal. No scleral  icterus. Neck supple.  Cardiovascular: Normal rate, regular rhythm.  Pulmonary/chest: Effort normal and breath sounds normal. No wheezing, rales or rhonchi. Abdominal: Soft, nondistended, nontender. Bowel sounds active throughout. There are no masses palpable. No hepatomegaly. Extremities: no edema Lymphadenopathy: No cervical adenopathy noted. Neurological: Alert and oriented to person place and time. Skin: Skin is warm and dry. No rashes noted. Psychiatric: Normal mood and affect. Behavior is normal.   ASSESSMENT AND PLAN:  42. 53 year old female with chronic, intermittent epigastric pain, progressive over the last few months, especially in last few weeks.. She is on chronic PPI therapy but had been using NSAIDS up  until recently. Rule out NSAID induced gastropathy / peptic ulcer disease. This might be functional in nature.  Doubt gallbladder disease. Will start with upper endoscopy for further evaluation. The benefits, risks, and potential complications of EGD with possible biopsies were discussed with the patient and she agrees to proceed. She agrees to hold NSAIDs for now. Continue daily PPI.   2. Microcytic anemia, likely related to significant vaginal bleeding secondary to uterine fibroids. Patient has recently started oral iron. No gastrointestinal bleeding. Hgb in 9 range per patient.   3. Thrombocytosis, to see hematology. Wonder if this is reaction to anemia / blood loss.   4. Normal colonoscopy (per patient) one year ago in [place], [place]. Patient will get results sent to [place]    Addendum: received labs from PCP at the end of the office visit. Patient's platelet count is in fact elevated at 627, hemoglobin 9.6, it was 9.7 9 months ago but one year ago was 15.4. MCV low at 68  Addendum May 09/08/14- colonoscopy report from [facility] in [place], [place]. Extent of exam to the cecum. Quality of the prep was good. One small polyp in the sigmoid colon, exam otherwise normal. Polyp path c/w with hyperplastic polyp. Next colonoscopy probably due October 2025

## 2014-08-12 NOTE — Progress Notes (Signed)
Reviewed and agree with management plan.  Ofelia Podolski T. Oswin Griffith, MD FACG 

## 2014-08-15 ENCOUNTER — Ambulatory Visit
Admission: RE | Admit: 2014-08-15 | Discharge: 2014-08-15 | Disposition: A | Discharge status: Home / Self Care | Payer: Managed Care, Other (non HMO) | Source: Ambulatory Visit | Attending: Otolaryngology | Admitting: Otolaryngology

## 2014-08-15 ENCOUNTER — Other Ambulatory Visit: Payer: Self-pay | Admitting: Otolaryngology

## 2014-08-15 DIAGNOSIS — J329 Chronic sinusitis, unspecified: Secondary | ICD-10-CM

## 2014-08-19 ENCOUNTER — Encounter: Payer: Self-pay | Admitting: Gastroenterology

## 2014-09-06 ENCOUNTER — Ambulatory Visit (AMBULATORY_SURGERY_CENTER): Payer: Managed Care, Other (non HMO) | Admitting: Gastroenterology

## 2014-09-06 ENCOUNTER — Encounter: Payer: Self-pay | Admitting: Gastroenterology

## 2014-09-06 VITALS — BP 128/69 | HR 83 | Temp 97.8°F | Resp 11 | Ht 64.0 in | Wt 179.0 lb

## 2014-09-06 DIAGNOSIS — R1013 Epigastric pain: Secondary | ICD-10-CM

## 2014-09-06 DIAGNOSIS — K317 Polyp of stomach and duodenum: Secondary | ICD-10-CM

## 2014-09-06 DIAGNOSIS — D131 Benign neoplasm of stomach: Secondary | ICD-10-CM

## 2014-09-06 MED ORDER — SODIUM CHLORIDE 0.9 % IV SOLN: 500.0000 mL | INTRAVENOUS | Status: DC

## 2014-09-06 NOTE — Patient Instructions (Signed)
Discharge instructions given with verbal understanding. Office will  Schedule abdominal ultrasound.biopsies taken. Resume previous medications. YOU HAD AN ENDOSCOPIC PROCEDURE TODAY AT [facility]: Refer to the procedure report that was given to you for any specific questions about what was found during the examination.  If the procedure report does not answer your questions, please call your gastroenterologist to clarify.  If you requested that your care partner not be given the details of your procedure findings, then the procedure report has been included in a sealed envelope for you to review at your convenience later.  YOU SHOULD EXPECT: Some feelings of bloating in the abdomen. Passage of more gas than usual.  Walking can help get rid of the air that was put into your GI tract during the procedure and reduce the bloating. If you had a lower endoscopy (such as a colonoscopy or flexible sigmoidoscopy) you may notice spotting of blood in your stool or on the toilet paper. If you underwent a bowel prep for your procedure, then you may not have a normal bowel movement for a few days.  DIET: Your first meal following the procedure should be a light meal and then it is ok to progress to your normal diet.  A half-sandwich or bowl of soup is an example of a good first meal.  Heavy or fried foods are harder to digest and may make you feel nauseous or bloated.  Likewise meals heavy in dairy and vegetables can cause extra gas to form and this can also increase the bloating.  Drink plenty of fluids but you should avoid alcoholic beverages for 24 hours.  ACTIVITY: Your care partner should take you home directly after the procedure.  You should plan to take it easy, moving slowly for the rest of the day.  You can resume normal activity the day after the procedure however you should NOT DRIVE or use heavy machinery for 24 hours (because of the sedation medicines used during the test).    SYMPTOMS  TO REPORT IMMEDIATELY: A gastroenterologist can be reached at any hour.  During normal business hours, 8:30 AM to 5:00 PM Monday through Friday, call [phone].  After hours and on weekends, please call the GI answering service at [phone] who will take a message and have the physician on call contact you.   Following upper endoscopy (EGD)  Vomiting of blood or coffee ground material  New chest pain or pain under the shoulder blades  Painful or persistently difficult swallowing  New shortness of breath  Fever of 100F or higher  Black, tarry-looking stools  FOLLOW UP: If any biopsies were taken you will be contacted by phone or by letter within the next 1-3 weeks.  Call your gastroenterologist if you have not heard about the biopsies in 3 weeks.  Our staff will call the home number listed on your records the next business day following your procedure to check on you and address any questions or concerns that you may have at that time regarding the information given to you following your procedure. This is a courtesy call and so if there is no answer at the home number and we have not heard from you through the emergency physician on call, we will assume that you have returned to your regular daily activities without incident.  SIGNATURES/CONFIDENTIALITY: You and/or your care partner have signed paperwork which will be entered into your electronic medical record.  These signatures attest to the fact that that the  information above on your After Visit Summary has been reviewed and is understood.  Full responsibility of the confidentiality of this discharge information lies with you and/or your care-partner.

## 2014-09-06 NOTE — Progress Notes (Signed)
Called to room to assist during endoscopic procedure.  Patient ID and intended procedure confirmed with present staff. Received instructions for my participation in the procedure from the performing physician.  

## 2014-09-06 NOTE — Progress Notes (Signed)
Pt. Notes slight skin eruptions on right lower arm and outer leg and knee. Broke out two days ago and has spread.

## 2014-09-06 NOTE — Op Note (Signed)
[place]  [address] [place], [id number]   ENDOSCOPY PROCEDURE REPORT  PATIENT: [name], [name]  MR#: [id number] BIRTHDATE: [birth year] , 52  yrs. old GENDER: female ENDOSCOPIST: Ladene Artist, MD, [facility] REFERRED BY:  Joni Reining, [place] PROCEDURE DATE:  09/06/2014 PROCEDURE:  EGD w/ biopsy ASA CLASS:     Class II INDICATIONS:  epigastric pain. MEDICATIONS: Monitored anesthesia care and Propofol 240 mg IV TOPICAL ANESTHETIC: none DESCRIPTION OF PROCEDURE: After the risks benefits and alternatives of the procedure were thoroughly explained, informed consent was obtained.  The LB [id number] endoscope was introduced through the mouth and advanced to the second portion of the duodenum , Without limitations.  The instrument was slowly withdrawn as the mucosa was fully examined.    STOMACH: Multiple sessile polyps ranging between 3-23mm in size were found in the gastric fundus and gastric body.  Multiple sampling biopsies were performed.   The stomach otherwise appeared normal. DUODENUM:  Normal appearing mucosa in the buld and descending duodenum. ESOPHAGUS: The mucosa of the esophagus appeared normal.   The mucosa of the esophagus appeared normal.  Retroflexed views revealed no abnormalities.   The scope was then withdrawn from the patient and the procedure completed.  COMPLICATIONS: There were no immediate complications.  ENDOSCOPIC IMPRESSION: 1.   Multiple sessile polyps in the gastric fundus and gastric body; multiple sampling biopsies performed 2.   The EGD otherwise appeared normal  RECOMMENDATIONS: 1.  Await pathology results 2.  Continue PPI 3.  Abdominal Ultrasound  eSigned:  Ladene Artist, MD, [facility] 09/06/2014 3:53 PM

## 2014-09-06 NOTE — Progress Notes (Signed)
  [facility] Post-op Note  Patient: [name] [name]  Procedure(s) Performed: endoscopy  Patient Location: [facility] - Recovery Area  Anesthesia Type: Deep Sedation/Propofol  Level of Consciousness: awake, oriented and patient cooperative  Airway and Oxygen Therapy: Patient Spontanous Breathing  Post-op Pain: none  Post-op Assessment:  Post-op Vital signs reviewed, Patient's Cardiovascular Status Stable, Respiratory Function Stable, Patent Airway, No signs of Nausea or vomiting and Pain level controlled  Post-op Vital Signs: Reviewed and stable  Complications: No apparent anesthesia complications  Samyukta Cura E 3:58 PM

## 2014-09-07 ENCOUNTER — Telehealth: Payer: Self-pay | Admitting: *Deleted

## 2014-09-07 ENCOUNTER — Telehealth: Payer: Self-pay

## 2014-09-07 DIAGNOSIS — R1013 Epigastric pain: Secondary | ICD-10-CM

## 2014-09-07 NOTE — Telephone Encounter (Signed)
No answer, message left for the patient. 

## 2014-09-07 NOTE — Telephone Encounter (Signed)
Patient is scheduled for an abdominal US per procedure report from 09/06/14 at [facility] 09/15/14 8:00 Patient to be NPO after midnight and register in radiology by 7:45 Left message for patient to call back to discuss

## 2014-09-12 ENCOUNTER — Encounter: Payer: Self-pay | Admitting: Gastroenterology

## 2014-09-12 NOTE — Telephone Encounter (Signed)
Left message for patient to call back  

## 2014-09-12 NOTE — Telephone Encounter (Signed)
Patient notified of [place] scheduled at [facility] for 09/15/14.  She verbalized understanding of all instructions

## 2014-09-15 ENCOUNTER — Ambulatory Visit (HOSPITAL_COMMUNITY)
Admission: RE | Admit: 2014-09-15 | Discharge: 2014-09-15 | Disposition: A | Discharge status: Home / Self Care | Payer: Managed Care, Other (non HMO) | Source: Ambulatory Visit | Attending: Gastroenterology | Admitting: Gastroenterology

## 2014-09-15 DIAGNOSIS — R1013 Epigastric pain: Secondary | ICD-10-CM | POA: Diagnosis present

## 2014-09-15 DIAGNOSIS — R161 Splenomegaly, not elsewhere classified: Secondary | ICD-10-CM | POA: Diagnosis not present

## 2014-09-15 DIAGNOSIS — K8020 Calculus of gallbladder without cholecystitis without obstruction: Secondary | ICD-10-CM | POA: Diagnosis not present

## 2014-09-16 ENCOUNTER — Telehealth: Payer: Self-pay | Admitting: Nurse Practitioner

## 2014-09-16 NOTE — Telephone Encounter (Signed)
[name] [name] patient would like to discuss her ultrasound results with you [name] already looked at ultrasound and [name] called patient about appointment to [facility] Unsure what patient will like to discuss

## 2014-09-19 ENCOUNTER — Encounter: Payer: Self-pay | Admitting: Nurse Practitioner

## 2014-09-19 NOTE — Telephone Encounter (Signed)
Error

## 2014-09-19 NOTE — Telephone Encounter (Signed)
Pt calling back asking to speak with Tye Savoy about her ultrasound results

## 2014-09-28 ENCOUNTER — Telehealth: Payer: Self-pay | Admitting: Nurse Practitioner

## 2014-09-28 NOTE — Telephone Encounter (Signed)
All questions answered about upcoming appt for [facility].  She is traveling out of the country in the next week.  She is advised to remain on a low fat diet.  She will call back for any additional questions or concerns

## 2014-11-01 ENCOUNTER — Other Ambulatory Visit (INDEPENDENT_AMBULATORY_CARE_PROVIDER_SITE_OTHER): Payer: Self-pay | Admitting: General Surgery

## 2014-11-09 ENCOUNTER — Encounter (HOSPITAL_COMMUNITY)
Admission: RE | Admit: 2014-11-09 | Discharge: 2014-11-09 | Disposition: A | Discharge status: Home / Self Care | Payer: Managed Care, Other (non HMO) | Source: Ambulatory Visit | Attending: General Surgery | Admitting: General Surgery

## 2014-11-09 ENCOUNTER — Encounter (HOSPITAL_COMMUNITY): Payer: Self-pay

## 2014-11-09 DIAGNOSIS — Z01812 Encounter for preprocedural laboratory examination: Secondary | ICD-10-CM | POA: Diagnosis not present

## 2014-11-09 HISTORY — DX: Anemia, unspecified: D64.9

## 2014-11-09 HISTORY — DX: Other complications of anesthesia, initial encounter: T88.59XA

## 2014-11-09 HISTORY — DX: Calculus of gallbladder without cholecystitis without obstruction: K80.20

## 2014-11-09 HISTORY — DX: Adverse effect of unspecified anesthetic, initial encounter: T41.45XA

## 2014-11-09 LAB — CBC WITH DIFFERENTIAL/PLATELET
Basophils Absolute: 0.1 10*3/uL (ref 0.0–0.1)
Basophils Relative: 2 % — ABNORMAL HIGH (ref 0–1)
Eosinophils Absolute: 0.1 10*3/uL (ref 0.0–0.7)
Eosinophils Relative: 2 % (ref 0–5)
HCT: 45.2 % (ref 36.0–46.0)
Hemoglobin: 15.3 g/dL — ABNORMAL HIGH (ref 12.0–15.0)
Lymphocytes Relative: 8 % — ABNORMAL LOW (ref 12–46)
Lymphs Abs: 0.6 10*3/uL — ABNORMAL LOW (ref 0.7–4.0)
MCH: 29.4 pg (ref 26.0–34.0)
MCHC: 33.8 g/dL (ref 30.0–36.0)
MCV: 86.9 fL (ref 78.0–100.0)
Monocytes Absolute: 0.3 10*3/uL (ref 0.1–1.0)
Monocytes Relative: 4 % (ref 3–12)
Neutro Abs: 6 10*3/uL (ref 1.7–7.7)
Neutrophils Relative %: 84 % — ABNORMAL HIGH (ref 43–77)
Platelets: 600 10*3/uL — ABNORMAL HIGH (ref 150–400)
RBC: 5.2 MIL/uL — ABNORMAL HIGH (ref 3.87–5.11)
RDW: 22 % — ABNORMAL HIGH (ref 11.5–15.5)
WBC: 7.1 10*3/uL (ref 4.0–10.5)

## 2014-11-09 LAB — COMPREHENSIVE METABOLIC PANEL
ALT: 41 U/L — ABNORMAL HIGH (ref 0–35)
AST: 24 U/L (ref 0–37)
Albumin: 4.9 g/dL (ref 3.5–5.2)
Alkaline Phosphatase: 52 U/L (ref 39–117)
Anion gap: 4 — ABNORMAL LOW (ref 5–15)
BUN: 15 mg/dL (ref 6–23)
CO2: 28 mmol/L (ref 19–32)
Calcium: 9.2 mg/dL (ref 8.4–10.5)
Chloride: 103 mEq/L (ref 96–112)
Creatinine, Ser: 0.73 mg/dL (ref 0.50–1.10)
GFR calc Af Amer: >90 mL/min (ref >90)
GFR calc non Af Amer: >90 mL/min (ref >90)
Glucose, Bld: 103 mg/dL — ABNORMAL HIGH (ref 70–99)
Potassium: 3.8 mmol/L (ref 3.5–5.1)
Sodium: 135 mmol/L (ref 135–145)
Total Bilirubin: 0.8 mg/dL (ref 0.3–1.2)
Total Protein: 7.2 g/dL (ref 6.0–8.3)

## 2014-11-09 LAB — HCG, SERUM, QUALITATIVE: Preg, Serum: NEGATIVE

## 2014-11-09 NOTE — Progress Notes (Signed)
CBC/DIFF results done 11/09/2014 faed via EPIC to Dr Donne Hazel.

## 2014-11-09 NOTE — Patient Instructions (Signed)
Nilam Quakenbush  11/09/2014   Your procedure is scheduled on:   Wednesday  January 6th  Report to [facility]  Entrance and follow signs to               [place] at   11:10 AM.  Call this number if you have problems the morning of surgery [phone]   Remember:  Do not eat food After [place].  YOU MAY HAVE CLEAR LIQUIDS TO DRINK FROM MIDNIGHT UNTIL 7:OO AM DAY OF SURGERY - LIKE WATER, COFFEE ( JUST NO CREAM OR MILK ).  NOTHING TO DRINK AFTER 7:00 AM.     Take these medicines the morning of surgery with A SIP OF WATER:  ZYRTEC  OMEPRAZOLE.  MAY USE FLONASE.  MAY USE RESTASIS EYE DROPS.                               You may not have any metal on your body including hair pins and              piercings  Do not wear jewelry, make-up, lotions, powders or perfumes.             Do not wear nail polish.  Do not shave  48 hours prior to surgery.              Men may shave face and neck.   Do not bring valuables to the hospital. [place].  Contacts, dentures or bridgework may not be worn into surgery.  Leave suitcase in the car. After surgery it may be brought to your room.     Patients discharged the day of surgery will not be allowed to drive home.  Name and phone number of your driver:  ADDLEY BALLINGER  [phone]  Special Instructions: N/A              Please read over the following fact sheets you were given: _____________________________________________________________________             [facility] - Preparing for Surgery Before surgery, you can play an important role.  Because skin is not sterile, your skin needs to be as free of germs as possible.  You can reduce the number of germs on your skin by washing with CHG (chlorahexidine gluconate) soap before surgery.  CHG is an antiseptic cleaner which kills germs and bonds with the skin to continue killing germs  even after washing. Please DO NOT use if you have an allergy to CHG or antibacterial soaps.  If your skin becomes reddened/irritated stop using the CHG and inform your nurse when you arrive at Short Stay. Do not shave (including legs and underarms) for at least 48 hours prior to the first CHG shower.  You may shave your face/neck. Please follow these instructions carefully:  1.  Shower with CHG Soap the night before surgery and the  morning of Surgery.  2.  If you choose to wash your hair, wash your hair first as usual with your  normal  shampoo.  3.  After you shampoo, rinse your hair and body thoroughly to remove the  shampoo.  4.  Use CHG as you would any other liquid soap.  You can apply chg directly  to the skin and wash                       Gently with a scrungie or clean washcloth.  5.  Apply the CHG Soap to your body ONLY FROM THE NECK DOWN.   Do not use on face/ open                           Wound or open sores. Avoid contact with eyes, ears mouth and genitals (private parts).                       Wash face,  Genitals (private parts) with your normal soap.             6.  Wash thoroughly, paying special attention to the area where your surgery  will be performed.  7.  Thoroughly rinse your body with warm water from the neck down.  8.  DO NOT shower/wash with your normal soap after using and rinsing off  the CHG Soap.                9.  Pat yourself dry with a clean towel.            10.  Wear clean pajamas.            11.  Place clean sheets on your bed the night of your first shower and do not  sleep with pets. Day of Surgery : Do not apply any lotions/deodorants the morning of surgery.  Please wear clean clothes to the hospital/surgery center.  FAILURE TO FOLLOW THESE INSTRUCTIONS MAY RESULT IN THE CANCELLATION OF YOUR SURGERY PATIENT SIGNATURE_________________________________  NURSE  SIGNATURE__________________________________  ________________________________________________________________________

## 2014-11-09 NOTE — Pre-Procedure Instructions (Signed)
EKG AND CXR NOT NEEDED PER ANESTHESIOLOGIST'S GUIDELINES.

## 2014-11-14 ENCOUNTER — Other Ambulatory Visit (HOSPITAL_COMMUNITY): Payer: Managed Care, Other (non HMO)

## 2014-11-16 ENCOUNTER — Ambulatory Visit (HOSPITAL_COMMUNITY): Payer: Managed Care, Other (non HMO) | Admitting: Certified Registered Nurse Anesthetist

## 2014-11-16 ENCOUNTER — Encounter (HOSPITAL_COMMUNITY): Payer: Self-pay | Admitting: *Deleted

## 2014-11-16 ENCOUNTER — Encounter (HOSPITAL_COMMUNITY)
Admission: RE | Disposition: A | Discharge status: Home / Self Care | Payer: Self-pay | Source: Ambulatory Visit | Attending: General Surgery

## 2014-11-16 ENCOUNTER — Ambulatory Visit (HOSPITAL_COMMUNITY)
Admission: RE | Admit: 2014-11-16 | Discharge: 2014-11-16 | Disposition: A | Discharge status: Home / Self Care | Payer: Managed Care, Other (non HMO) | Source: Ambulatory Visit | Attending: General Surgery | Admitting: General Surgery

## 2014-11-16 DIAGNOSIS — K8010 Calculus of gallbladder with chronic cholecystitis without obstruction: Secondary | ICD-10-CM | POA: Diagnosis not present

## 2014-11-16 DIAGNOSIS — Z87891 Personal history of nicotine dependence: Secondary | ICD-10-CM | POA: Diagnosis not present

## 2014-11-16 DIAGNOSIS — M1990 Unspecified osteoarthritis, unspecified site: Secondary | ICD-10-CM | POA: Diagnosis not present

## 2014-11-16 DIAGNOSIS — Z79899 Other long term (current) drug therapy: Secondary | ICD-10-CM | POA: Insufficient documentation

## 2014-11-16 DIAGNOSIS — K219 Gastro-esophageal reflux disease without esophagitis: Secondary | ICD-10-CM | POA: Diagnosis not present

## 2014-11-16 DIAGNOSIS — K8020 Calculus of gallbladder without cholecystitis without obstruction: Secondary | ICD-10-CM | POA: Diagnosis present

## 2014-11-16 HISTORY — PX: CHOLECYSTECTOMY: SHX55

## 2014-11-16 SURGERY — LAPAROSCOPIC CHOLECYSTECTOMY
Anesthesia: General | Site: Abdomen

## 2014-11-16 MED ORDER — FENTANYL CITRATE 0.05 MG/ML IJ SOLN
INTRAMUSCULAR | Status: DC | PRN
  Administered 2014-11-16: 50 ug via INTRAVENOUS
  Administered 2014-11-16 (×2): 100 ug via INTRAVENOUS

## 2014-11-16 MED ORDER — OXYCODONE HCL 5 MG PO TABS: 5.0000 mg | ORAL_TABLET | Freq: Once | ORAL | Status: DC | PRN

## 2014-11-16 MED ORDER — PROPOFOL 10 MG/ML IV BOLUS
INTRAVENOUS | Status: AC
  Filled 2014-11-16: qty 20

## 2014-11-16 MED ORDER — BUPIVACAINE-EPINEPHRINE 0.25% -1:200000 IJ SOLN
INTRAMUSCULAR | Status: DC | PRN
  Administered 2014-11-16: 13 mL

## 2014-11-16 MED ORDER — MIDAZOLAM HCL 2 MG/2ML IJ SOLN
INTRAMUSCULAR | Status: AC
  Filled 2014-11-16: qty 2

## 2014-11-16 MED ORDER — BUPIVACAINE-EPINEPHRINE 0.25% -1:200000 IJ SOLN
INTRAMUSCULAR | Status: AC
  Filled 2014-11-16: qty 1

## 2014-11-16 MED ORDER — OXYCODONE HCL 5 MG PO TABS: 5.0000 mg | ORAL_TABLET | ORAL | Status: DC | PRN

## 2014-11-16 MED ORDER — SODIUM CHLORIDE 0.9 % IJ SOLN: 3.0000 mL | INTRAMUSCULAR | Status: DC | PRN

## 2014-11-16 MED ORDER — LACTATED RINGERS IV SOLN
INTRAVENOUS | Status: DC | PRN
  Administered 2014-11-16: 13:00:00 via INTRAVENOUS

## 2014-11-16 MED ORDER — GLYCOPYRROLATE 0.2 MG/ML IJ SOLN
INTRAMUSCULAR | Status: AC
  Filled 2014-11-16: qty 3

## 2014-11-16 MED ORDER — ACETAMINOPHEN 160 MG/5ML PO SOLN: 325.0000 mg | ORAL | Status: DC | PRN

## 2014-11-16 MED ORDER — LACTATED RINGERS IV SOLN
INTRAVENOUS | Status: DC
  Administered 2014-11-16 (×2): 1000 mL via INTRAVENOUS

## 2014-11-16 MED ORDER — HYDROCODONE-ACETAMINOPHEN 5-325 MG PO TABS
1.0000 | ORAL_TABLET | ORAL | Status: DC | PRN
  Administered 2014-11-16: 1 via ORAL
  Filled 2014-11-16: qty 1

## 2014-11-16 MED ORDER — GLYCOPYRROLATE 0.2 MG/ML IJ SOLN
INTRAMUSCULAR | Status: DC | PRN
  Administered 2014-11-16: 0.6 mg via INTRAVENOUS

## 2014-11-16 MED ORDER — DEXAMETHASONE SODIUM PHOSPHATE 10 MG/ML IJ SOLN
INTRAMUSCULAR | Status: DC | PRN
  Administered 2014-11-16: 10 mg via INTRAVENOUS

## 2014-11-16 MED ORDER — ONDANSETRON HCL 4 MG/2ML IJ SOLN
INTRAMUSCULAR | Status: DC | PRN
  Administered 2014-11-16: 4 mg via INTRAVENOUS

## 2014-11-16 MED ORDER — PROPOFOL 10 MG/ML IV BOLUS
INTRAVENOUS | Status: DC | PRN
  Administered 2014-11-16: 40 mg via INTRAVENOUS
  Administered 2014-11-16: 160 mg via INTRAVENOUS

## 2014-11-16 MED ORDER — HYDROMORPHONE HCL 1 MG/ML IJ SOLN
INTRAMUSCULAR | Status: AC
  Filled 2014-11-16: qty 1

## 2014-11-16 MED ORDER — ACETAMINOPHEN 650 MG RE SUPP
650.0000 mg | RECTAL | Status: DC | PRN
  Filled 2014-11-16: qty 1

## 2014-11-16 MED ORDER — ROCURONIUM BROMIDE 100 MG/10ML IV SOLN
INTRAVENOUS | Status: DC | PRN
  Administered 2014-11-16: 40 mg via INTRAVENOUS

## 2014-11-16 MED ORDER — HYDROCODONE-ACETAMINOPHEN 5-325 MG PO TABS: 1.0000 | ORAL_TABLET | ORAL | Status: DC | PRN

## 2014-11-16 MED ORDER — DEXTROSE 5 % IV SOLN
INTRAVENOUS | Status: AC
  Filled 2014-11-16: qty 2

## 2014-11-16 MED ORDER — LIDOCAINE HCL (CARDIAC) 20 MG/ML IV SOLN
INTRAVENOUS | Status: AC
  Filled 2014-11-16: qty 5

## 2014-11-16 MED ORDER — MORPHINE SULFATE 10 MG/ML IJ SOLN: 2.0000 mg | INTRAMUSCULAR | Status: DC | PRN

## 2014-11-16 MED ORDER — OXYCODONE HCL 5 MG/5ML PO SOLN: 5.0000 mg | Freq: Once | ORAL | Status: DC | PRN

## 2014-11-16 MED ORDER — NEOSTIGMINE METHYLSULFATE 10 MG/10ML IV SOLN
INTRAVENOUS | Status: DC | PRN
  Administered 2014-11-16: 4 mg via INTRAVENOUS

## 2014-11-16 MED ORDER — ACETAMINOPHEN 325 MG PO TABS: 325.0000 mg | ORAL_TABLET | ORAL | Status: DC | PRN

## 2014-11-16 MED ORDER — HYDROMORPHONE HCL 1 MG/ML IJ SOLN
0.2500 mg | INTRAMUSCULAR | Status: DC | PRN
  Administered 2014-11-16: 0.5 mg via INTRAVENOUS
  Administered 2014-11-16 (×2): 0.25 mg via INTRAVENOUS

## 2014-11-16 MED ORDER — FENTANYL CITRATE 0.05 MG/ML IJ SOLN
INTRAMUSCULAR | Status: AC
  Filled 2014-11-16: qty 5

## 2014-11-16 MED ORDER — ACETAMINOPHEN 325 MG PO TABS: 650.0000 mg | ORAL_TABLET | ORAL | Status: DC | PRN

## 2014-11-16 MED ORDER — SODIUM CHLORIDE 0.9 % IJ SOLN: 3.0000 mL | Freq: Two times a day (BID) | INTRAMUSCULAR | Status: DC

## 2014-11-16 MED ORDER — ROCURONIUM BROMIDE 100 MG/10ML IV SOLN
INTRAVENOUS | Status: AC
  Filled 2014-11-16: qty 1

## 2014-11-16 MED ORDER — DEXTROSE 5 % IV SOLN: 2.0000 g | INTRAVENOUS | Status: DC

## 2014-11-16 MED ORDER — LIDOCAINE HCL (CARDIAC) 20 MG/ML IV SOLN
INTRAVENOUS | Status: DC | PRN
  Administered 2014-11-16: 60 mg via INTRAVENOUS

## 2014-11-16 MED ORDER — SODIUM CHLORIDE 0.9 % IV SOLN: INTRAVENOUS | Status: DC

## 2014-11-16 MED ORDER — DEXTROSE 5 % IV SOLN
2.0000 g | INTRAVENOUS | Status: DC | PRN
  Administered 2014-11-16: 2 g via INTRAVENOUS

## 2014-11-16 MED ORDER — LACTATED RINGERS IV SOLN
INTRAVENOUS | Status: DC | PRN
  Administered 2014-11-16: 1000 mL

## 2014-11-16 MED ORDER — SODIUM CHLORIDE 0.9 % IV SOLN: 250.0000 mL | INTRAVENOUS | Status: DC | PRN

## 2014-11-16 MED ORDER — ONDANSETRON HCL 4 MG/2ML IJ SOLN
INTRAMUSCULAR | Status: AC
  Filled 2014-11-16: qty 2

## 2014-11-16 MED ORDER — NEOSTIGMINE METHYLSULFATE 10 MG/10ML IV SOLN
INTRAVENOUS | Status: AC
  Filled 2014-11-16: qty 1

## 2014-11-16 MED ORDER — MIDAZOLAM HCL 5 MG/5ML IJ SOLN
INTRAMUSCULAR | Status: DC | PRN
  Administered 2014-11-16: 2 mg via INTRAVENOUS

## 2014-11-16 MED ORDER — KETOROLAC TROMETHAMINE 15 MG/ML IJ SOLN: 15.0000 mg | Freq: Four times a day (QID) | INTRAMUSCULAR | Status: DC

## 2014-11-16 SURGICAL SUPPLY — 38 items
APL SKNCLS STERI-STRIP NONHPOA (GAUZE/BANDAGES/DRESSINGS)
APPLIER CLIP 5 13 M/L LIGAMAX5 (MISCELLANEOUS) ×2
APPLIER CLIP ROT 10 11.4 M/L (STAPLE)
APR CLP MED LRG 11.4X10 (STAPLE)
APR CLP MED LRG 5 ANG JAW (MISCELLANEOUS) ×1
BAG SPEC RTRVL 10 TROC 200 (ENDOMECHANICALS) ×1
BENZOIN TINCTURE PRP APPL 2/3 (GAUZE/BANDAGES/DRESSINGS) IMPLANT
CLIP APPLIE 5 13 M/L LIGAMAX5 (MISCELLANEOUS) ×1 IMPLANT
CLIP APPLIE ROT 10 11.4 M/L (STAPLE) IMPLANT
COVER MAYO STAND STRL (DRAPES) IMPLANT
DECANTER SPIKE VIAL GLASS SM (MISCELLANEOUS) ×2 IMPLANT
DEVICE TROCAR PUNCTURE CLOSURE (ENDOMECHANICALS) ×2 IMPLANT
DRAPE C-ARM 42X120 X-RAY (DRAPES) ×2 IMPLANT
DRAPE LAPAROSCOPIC ABDOMINAL (DRAPES) ×2 IMPLANT
ELECT REM PT RETURN 9FT ADLT (ELECTROSURGICAL) ×2
ELECTRODE REM PT RTRN 9FT ADLT (ELECTROSURGICAL) ×1 IMPLANT
GLOVE BIO SURGEON STRL SZ7 (GLOVE) ×10 IMPLANT
GLOVE BIOGEL PI IND STRL 7.5 (GLOVE) ×1 IMPLANT
GLOVE BIOGEL PI INDICATOR 7.5 (GLOVE) ×1
GOWN STRL REUS W/TWL LRG LVL3 (GOWN DISPOSABLE) ×4 IMPLANT
GOWN STRL REUS W/TWL XL LVL3 (GOWN DISPOSABLE) ×4 IMPLANT
KIT BASIN OR (CUSTOM PROCEDURE TRAY) ×2 IMPLANT
LIQUID BAND (GAUZE/BANDAGES/DRESSINGS) ×2 IMPLANT
POUCH RETRIEVAL ECOSAC 10 (ENDOMECHANICALS) ×1 IMPLANT
POUCH RETRIEVAL ECOSAC 10MM (ENDOMECHANICALS) ×1
SCISSORS LAP 5X35 DISP (ENDOMECHANICALS) ×2 IMPLANT
SET CHOLANGIOGRAPH MIX (MISCELLANEOUS) IMPLANT
SET IRRIG TUBING LAPAROSCOPIC (IRRIGATION / IRRIGATOR) ×2 IMPLANT
SLEEVE XCEL OPT CAN 5 100 (ENDOMECHANICALS) ×4 IMPLANT
STRIP CLOSURE SKIN 1/2X4 (GAUZE/BANDAGES/DRESSINGS) IMPLANT
SUT MNCRL AB 4-0 PS2 18 (SUTURE) ×4 IMPLANT
SUT VIC AB 0 CT1 36 (SUTURE) ×2 IMPLANT
TOWEL OR 17X26 10 PK STRL BLUE (TOWEL DISPOSABLE) ×2 IMPLANT
TOWEL OR NON WOVEN STRL DISP B (DISPOSABLE) ×2 IMPLANT
TRAY LAPAROSCOPIC (CUSTOM PROCEDURE TRAY) ×2 IMPLANT
TROCAR BLADELESS OPT 5 100 (ENDOMECHANICALS) ×2 IMPLANT
TROCAR XCEL BLUNT TIP 100MML (ENDOMECHANICALS) ×2 IMPLANT
TROCAR XCEL NON-BLD 11X100MML (ENDOMECHANICALS) ×2 IMPLANT

## 2014-11-16 NOTE — Interval H&P Note (Signed)
History and Physical Interval Note:  11/16/2014 12:31 PM  [name] [name]  has presented today for surgery, with the diagnosis of Gallstones  The various methods of treatment have been discussed with the patient and family. After consideration of risks, benefits and other options for treatment, the patient has consented to  Procedure(s): LAPAROSCOPIC CHOLECYSTECTOMY (N/A) as a surgical intervention .  The patient's history has been reviewed, patient examined, no change in status, stable for surgery.  I have reviewed the patient's chart and labs.  Questions were answered to the patient's satisfaction.     Merla Sawka

## 2014-11-16 NOTE — Anesthesia Preprocedure Evaluation (Addendum)
Anesthesia Evaluation  Patient identified by MRN, date of birth, ID band Patient awake    Reviewed: Allergy & Precautions, NPO status , Patient's Chart, lab work & pertinent test results  History of Anesthesia Complications Negative for: history of anesthetic complications  Airway Mallampati: II  TM Distance: >3 FB Neck ROM: Full    Dental  (+) Teeth Intact   Pulmonary neg shortness of breath, neg sleep apnea, neg COPDneg recent URI, former smoker,  breath sounds clear to auscultation        Cardiovascular negative cardio ROS  Rhythm:Regular     Neuro/Psych negative neurological ROS  negative psych ROS   GI/Hepatic Neg liver ROS, hiatal hernia, GERD-  Medicated and Controlled,  Endo/Other  negative endocrine ROS  Renal/GU negative Renal ROS     Musculoskeletal  (+) Arthritis -,   Abdominal   Peds  Hematology   Anesthesia Other Findings   Reproductive/Obstetrics                            Anesthesia Physical Anesthesia Plan  ASA: II  Anesthesia Plan: General   Post-op Pain Management:    Induction: Intravenous  Airway Management Planned: Oral ETT  Additional Equipment: None  Intra-op Plan:   Post-operative Plan: Extubation in OR  Informed Consent: I have reviewed the patients History and Physical, chart, labs and discussed the procedure including the risks, benefits and alternatives for the proposed anesthesia with the patient or authorized representative who has indicated his/her understanding and acceptance.   Dental advisory given  Plan Discussed with: CRNA and Surgeon  Anesthesia Plan Comments:         Anesthesia Quick Evaluation

## 2014-11-16 NOTE — Anesthesia Postprocedure Evaluation (Signed)
  Anesthesia Post-op Note  Patient: [name] [name]  Procedure(s) Performed: Procedure(s): LAPAROSCOPIC CHOLECYSTECTOMY (N/A)  Patient Location: PACU  Anesthesia Type:General  Level of Consciousness: awake and alert   Airway and Oxygen Therapy: Patient Spontanous Breathing  Post-op Pain: mild  Post-op Assessment: Post-op Vital signs reviewed, Patient's Cardiovascular Status Stable, Respiratory Function Stable, Patent Airway, No signs of Nausea or vomiting and Pain level controlled  Post-op Vital Signs: Reviewed and stable  Last Vitals:  Filed Vitals:   11/16/14 1600  BP: 124/65  Pulse: 70  Temp: 36.4 C  Resp: 16    Complications: No apparent anesthesia complications

## 2014-11-16 NOTE — Anesthesia Procedure Notes (Signed)
Procedure Name: Intubation Date/Time: 11/16/2014 1:36 PM Performed by: Glory Buff Pre-anesthesia Checklist: Patient identified, Emergency Drugs available, Suction available, Patient being monitored and Timeout performed Patient Re-evaluated:Patient Re-evaluated prior to inductionOxygen Delivery Method: Circle system utilized Preoxygenation: Pre-oxygenation with 100% oxygen Intubation Type: IV induction Ventilation: Mask ventilation without difficulty Laryngoscope Size: Miller and 2 Grade View: Grade I Tube type: Oral Tube size: 7.0 mm Number of attempts: 1 Placement Confirmation: ETT inserted through vocal cords under direct vision,  positive ETCO2 and breath sounds checked- equal and bilateral Secured at: 20 cm Tube secured with: Tape

## 2014-11-16 NOTE — Op Note (Signed)
Preoperative diagnosis: Symptomatic cholelithiasis Postoperative diagnosis: same as above Procedure: Laparoscopic cholecystectomy  Surgeon: Dr. Serita Grammes Anesthesia: Gen. Estimated blood loss: Minimal Complications: None Drains: None Specimens: Gallbladder and contents to pathology Sponge count correct at completion Description to recovery stable  Indications: This a 43 yof with signs and symptoms of biliary colic. She has ultrasound with multiple gallstones. We discussed laparoscopic cholecystectomy.  Procedure: After informed consent was obtained she was taken to the operating room. She was given antibiotics. Sequential compression devices were on her legs. She was placed under general anesthesia without complication. Her abdomen was prepped and draped in the standard sterile surgical fashion. A surgical timeout was then performed.  I infiltrated marcaine below her umbilicus. I made a vertical incision and entered into the peritoneum bluntly. A 0 vicryl pursestring was placed and a hasson trocar introduced.The abdomen was insufflated to 15 mm Hg pressure.. I then placed 3 further 5 mm trocars in the epigastrium and right upper quadrant under direct vision without complication. I then was able to grasp the gallbladder. I was able to dissect the ductal structures. I identified the critical view of safety. I clipped and divided the cystic artery. I then clipped the duct distally. I divided this. These clips completely traversed the duct and the duct was viable. I then removed the gallbladder from the liver bed without difficulty.  This was then placed in a bag. This was eventually removed from the umbilicus. Hemostasis was observed. I removed my hasson trocar and closed my pursestring suture. I did place 2 additional 0 vicryl sutures with the endoclose device to completely obliterate the defect.  After I closed this I then removed the trocars and desufflated the abdomen. I then closed the  skin with 4-0 Monocryl and glue. Steri-Strips were placed over this. She tolerated this was extended and transferred to the recovery room in stable condition.

## 2014-11-16 NOTE — H&P (Signed)
54 yof who is otherwise healthy. she has nl csc in last few years. History of egd in 2003 with [place]. she has since May some epigastric pain primarily when she awakens that goes away in am. She does use prilosec in am. She has also had some ruq pain and some pain underneath right shoulder blade. She has looser bms at times. She has no n/v. She has undergone evaluation with egd that showed some benign polyps. She underwent US that shows multiple small gallstones. She comes in today to discuss gb as source of pain. Her husband (who has passed ) had history of difficult gb so she is very aware of issues that can occur.   Other Problems Marjean Donna, [place]; 11/01/2014 10:05 AM) Cholelithiasis Gastroesophageal Reflux Disease Other disease, cancer, significant illness  Past Surgical History Marjean Donna, [place]; 11/01/2014 10:05 AM) Cesarean Section - 1 Hip Surgery Left.  Diagnostic Studies History Marjean Donna, [place]; 11/01/2014 10:05 AM) Colonoscopy 1-5 years ago Mammogram within last year Pap Smear 1-5 years ago  Allergies Marjean Donna, [place]; 11/01/2014 10:06 AM) Nitrofurantoin *URINARY ANTI-INFECTIVES*  Medication History (Sonya Bynum, CMA; 11/01/2014 10:07 AM) ZyrTEC Allergy Childrens (10MG  Tablet Disperse, Oral) Active. Restasis (0.05% Emulsion, Ophthalmic as needed) Active. Ferrous Sulfate (325 (65 Fe)MG Tablet, Oral) Active. Flonase Allergy Relief (50MCG/ACT Suspension, Nasal as needed) Active. Omeprazole (20MG  Tablet DR, Oral) Active.  Social History Marjean Donna, [place]; 11/01/2014 10:05 AM) Alcohol use Moderate alcohol use. Caffeine use Coffee. No drug use Tobacco use Never smoker.  Family History Marjean Donna, [place]; 11/01/2014 10:05 AM) Alcohol Abuse Mother. Arthritis Father, Mother. Breast Cancer Mother. Colon Polyps Mother. Depression Mother. Diabetes Mellitus Family Members In General. Heart Disease Family Members In General. Hypertension  Mother. Respiratory Condition Family Members In General.  Pregnancy / Birth History Marjean Donna, [place]; 11/01/2014 10:05 AM) Age at menarche 50 years. Contraceptive History Oral contraceptives. Gravida 1 Irregular periods Maternal age 62-30 Para 1  Review of Systems ([place]; 11/01/2014 10:05 AM) General Present- Night Sweats. Not Present- Appetite Loss, Chills, Fatigue, Fever, Weight Gain and Weight Loss. HEENT Present- Wears glasses/contact lenses. Not Present- Earache, Hearing Loss, Hoarseness, Nose Bleed, Oral Ulcers, Ringing in the Ears, Seasonal Allergies, Sinus Pain, Sore Throat, Visual Disturbances and Yellow Eyes. Respiratory Present- Snoring. Not Present- Bloody sputum, Chronic Cough, Difficulty Breathing and Wheezing. Breast Not Present- Breast Mass, Breast Pain, Nipple Discharge and Skin Changes. Cardiovascular Not Present- Chest Pain, Difficulty Breathing Lying Down, Leg Cramps, Palpitations, Rapid Heart Rate, Shortness of Breath and Swelling of Extremities. Gastrointestinal Present- Change in Bowel Habits and Indigestion. Not Present- Abdominal Pain, Bloating, Bloody Stool, Chronic diarrhea, Constipation, Difficulty Swallowing, Excessive gas, Gets full quickly at meals, Hemorrhoids, Nausea, Rectal Pain and Vomiting. Female Genitourinary Not Present- Frequency, Nocturia, Painful Urination, Pelvic Pain and Urgency. Musculoskeletal Present- Joint Pain. Not Present- Back Pain, Joint Stiffness, Muscle Pain, Muscle Weakness and Swelling of Extremities. Neurological Present- Headaches. Not Present- Decreased Memory, Fainting, Numbness, Seizures, Tingling, Tremor, Trouble walking and Weakness. Psychiatric Not Present- Anxiety, Bipolar, Change in Sleep Pattern, Depression, Fearful and Frequent crying. Endocrine Not Present- Cold Intolerance, Excessive Hunger, Hair Changes, Heat Intolerance, Hot flashes and New Diabetes. Hematology Not Present- Easy Bruising, Excessive  bleeding, Gland problems, HIV and Persistent Infections.   Vitals (Sonya Bynum CMA; 11/01/2014 10:05 AM) 11/01/2014 10:05 AM Weight: 175 lb Height: 67in Body Surface Area: 1.94 m Body Mass Index: 27.41 kg/m Pulse: 79 (Regular)  BP: 122/80 (Sitting, Left Arm, Standard)    Physical Exam (  Rolm Bookbinder MD; 11/01/2014 12:22 PM) General Mental Status-Alert. Orientation-Oriented X3.  Chest and Lung Exam Chest and lung exam reveals -on auscultation, normal breath sounds, no adventitious sounds and normal vocal resonance.  Cardiovascular Cardiovascular examination reveals -normal heart sounds, regular rate and rhythm with no murmurs.  Abdomen Note: soft mild tenderness to ruq palpation, nondistended     Assessment & Plan Rolm Bookbinder MD; 11/01/2014 12:23 PM) SYMPTOMATIC CHOLELITHIASIS (574.20  K80.20) Impression: Lap chole I do think some of her symptoms are likely from gb. I discussed the procedure in detail. The patient was given Neurosurgeon. We discussed the risks and benefits of a laparoscopic cholecystectomy and possible cholangiogram including, but not limited to bleeding, infection, injury to surrounding structures such as the intestine or liver, bile leak, retained gallstones, need to convert to an open procedure, prolonged diarrhea, blood clots such as DVT, common bile duct injury, anesthesia risks, and possible need for additional procedures. The likelihood of improvement in symptoms and return to the patient's normal status is good. We discussed the typical post-operative recovery course.

## 2014-11-16 NOTE — Discharge Instructions (Signed)
[facility] LAPAROSCOPIC SURGERY: POST OP INSTRUCTIONS  Always review your discharge instruction sheet given to you by the facility where your surgery was performed. IF YOU HAVE DISABILITY OR FAMILY LEAVE FORMS, YOU MUST BRING THEM TO THE OFFICE FOR PROCESSING.   DO NOT GIVE THEM TO YOUR DOCTOR.  1. A prescription for pain medication may be given to you upon discharge.  Take your pain medication as prescribed, if needed.  If narcotic pain medicine is not needed, then you may take acetaminophen (Tylenol), naprosyn (Alleve), or ibuprofen (Advil) as needed. 2. Take your usually prescribed medications unless otherwise directed. 3. If you need a refill on your pain medication, please contact your pharmacy.  They will contact our office to request authorization. Prescriptions will not be filled after 5pm or on week-ends. 4. You should follow a light diet the first few days after arrival home, such as soup and crackers, etc.  Be sure to include lots of fluids daily. 5. Most patients will experience some swelling and bruising in the area of the incisions.  Ice packs will help.  Swelling and bruising can take several days to resolve.  6. It is common to experience some constipation if taking pain medication after surgery.  Increasing fluid intake and taking a stool softener (such as Colace) will usually help or prevent this problem from occurring.  A mild laxative (Milk of Magnesia or Miralax) should be taken according to package instructions if there are no bowel movements after 48 hours. 7. Unless discharge instructions indicate otherwise, you may remove your bandages 48 hours after surgery, and you may shower at that time.  You may have steri-strips (small skin tapes) in place directly over the incision.  These strips should be left on the skin for 7-10 days.  If your surgeon used skin glue on the incision, you may shower in 24 hours.  The glue will flake  off over the next 2-3 weeks.  Any sutures or staples will be removed at the office during your follow-up visit. 8. ACTIVITIES:  You may resume regular (light) daily activities beginning the next day--such as daily self-care, walking, climbing stairs--gradually increasing activities as tolerated.  You may have sexual intercourse when it is comfortable.  Refrain from any heavy lifting or straining until approved by your doctor. a. You may drive when you are no longer taking prescription pain medication, you can comfortably wear a seatbelt, and you can safely maneuver your car and apply brakes. b. RETURN TO WORK:  __________________________________________________________ 9. You should see your doctor in the office for a follow-up appointment approximately 2-3 weeks after your surgery.  Make sure that you call for this appointment within a day or two after you arrive home to insure a convenient appointment time. 10. OTHER INSTRUCTIONS: __________________________________________________________________________________________________________________________ __________________________________________________________________________________________________________________________ WHEN TO CALL YOUR DOCTOR: 1. Fever over 101.0 2. Inability to urinate 3. Continued bleeding from incision. 4. Increased pain, redness, or drainage from the incision. 5. Increasing abdominal pain  The clinic staff is available to answer your questions during regular business hours.  Please don't hesitate to call and ask to speak to one of the nurses for clinical concerns.  If you have a medical emergency, go to the nearest emergency room or call 911.  A surgeon from [facility] is always on call at the hospital. [address], [address], [place], [place]  [address] ? [address] [address], [place], [place]   [address] [phone] ? [phone] ? [phone] Web site: [email or web address]  

## 2014-11-16 NOTE — Transfer of Care (Signed)
Immediate Anesthesia Transfer of Care Note  Patient: [name] [name]  Procedure(s) Performed: Procedure(s): LAPAROSCOPIC CHOLECYSTECTOMY (N/A)  Patient Location: PACU  Anesthesia Type:General  Level of Consciousness: awake, alert  and oriented  Airway & Oxygen Therapy: Patient Spontanous Breathing and Patient connected to face mask oxygen  Post-op Assessment: Report given to PACU RN and Post -op Vital signs reviewed and stable  Post vital signs: Reviewed and stable  Complications: No apparent anesthesia complications

## 2014-11-17 ENCOUNTER — Encounter (HOSPITAL_COMMUNITY): Payer: Self-pay | Admitting: General Surgery

## 2015-06-06 IMAGING — US US ABDOMEN COMPLETE
1 series · 13 of 25 positions shown · non-contrast
Comparison: No similar prior exam is available at this institution
for comparison or on [HOSPITAL] PACS.

CLINICAL DATA: Epigastric abdominal pain.  Endoscopy 9 days ago.

EXAM:
ULTRASOUND ABDOMEN COMPLETE

[Series 1: us abdomen complete · 0.22mm/px · 13 of 92 slices shown]
[im 1/92]
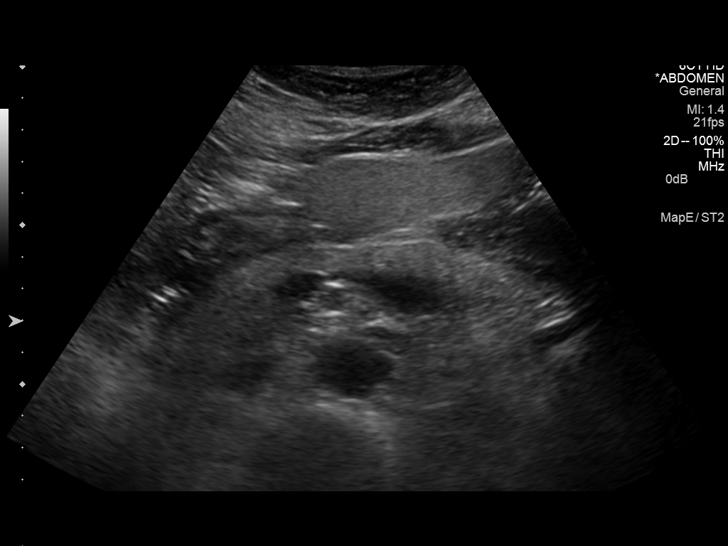
[im 8/92]
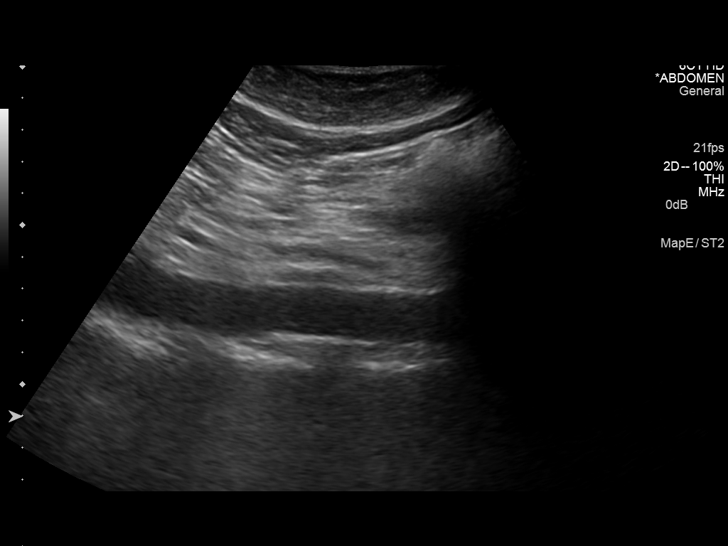
[im 16/92]
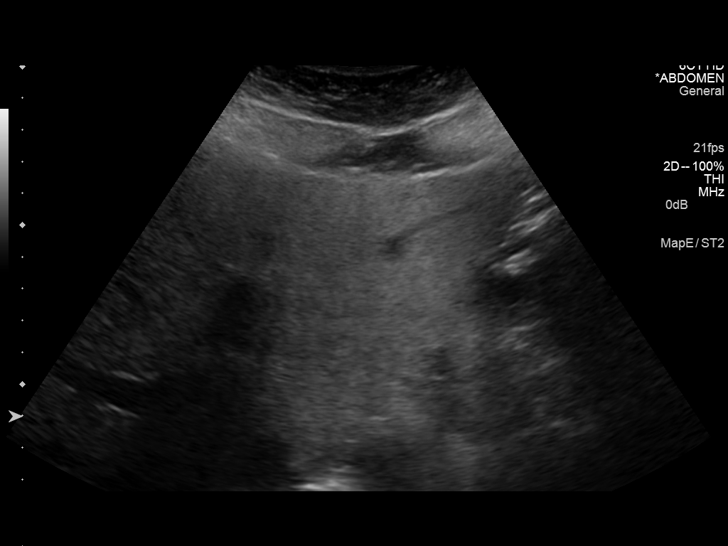
[im 23/92]
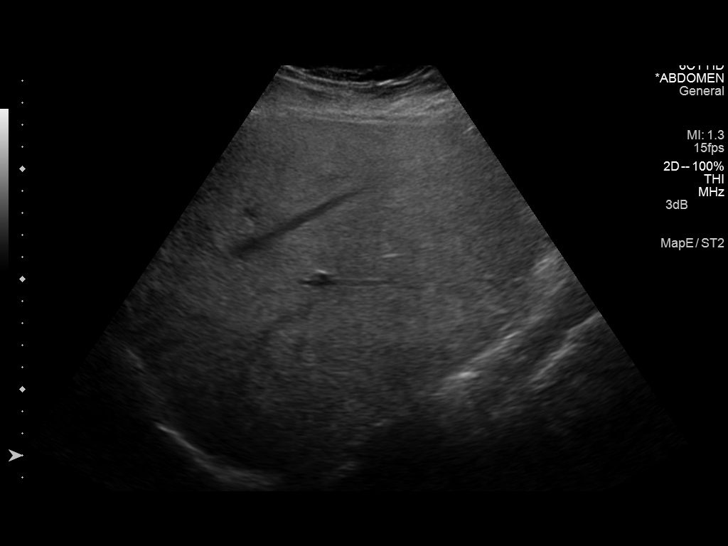
[im 31/92]
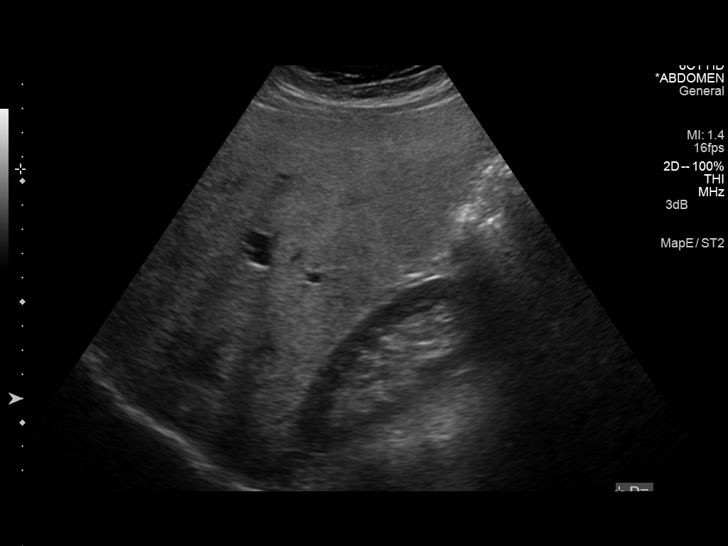
[im 38/92]
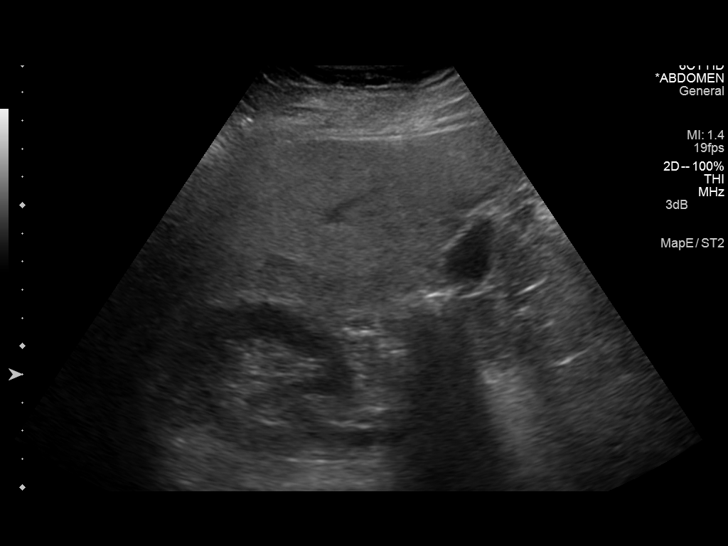
[im 46/92]
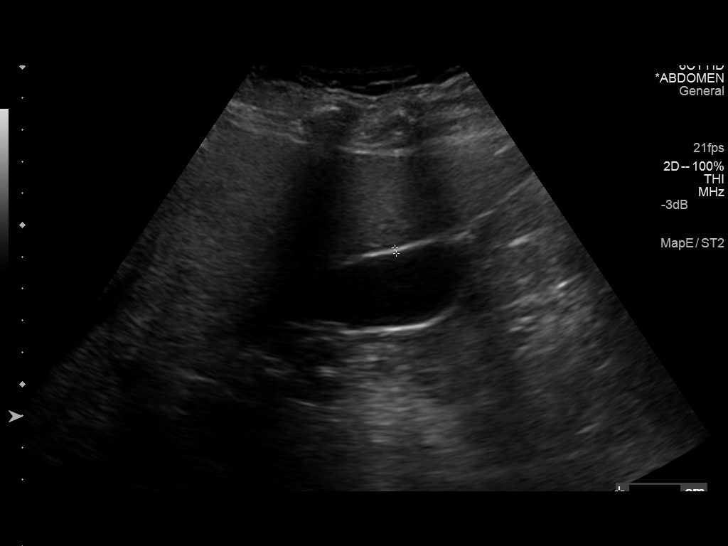
[im 54/92]
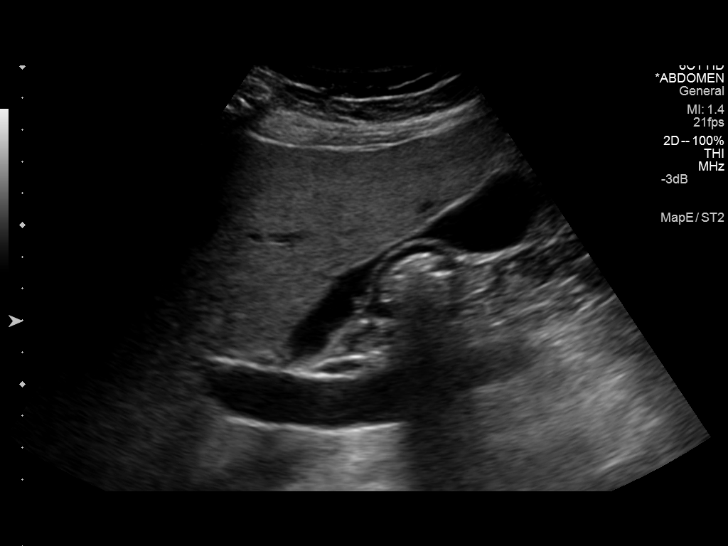
[im 61/92]
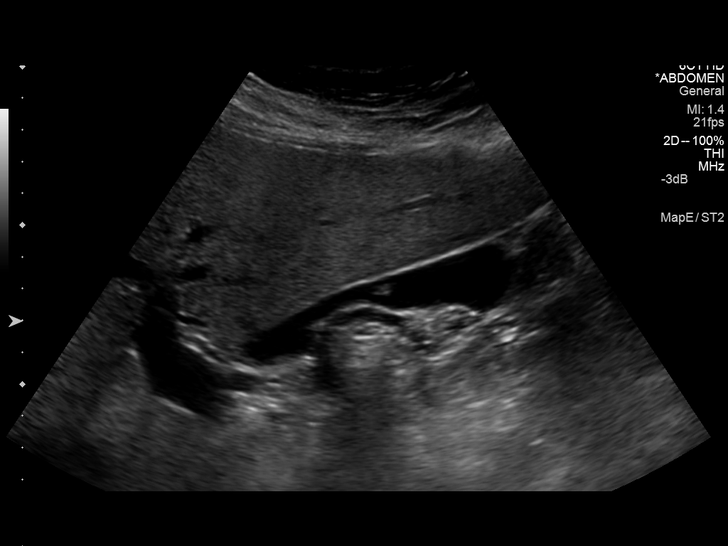
[im 69/92]
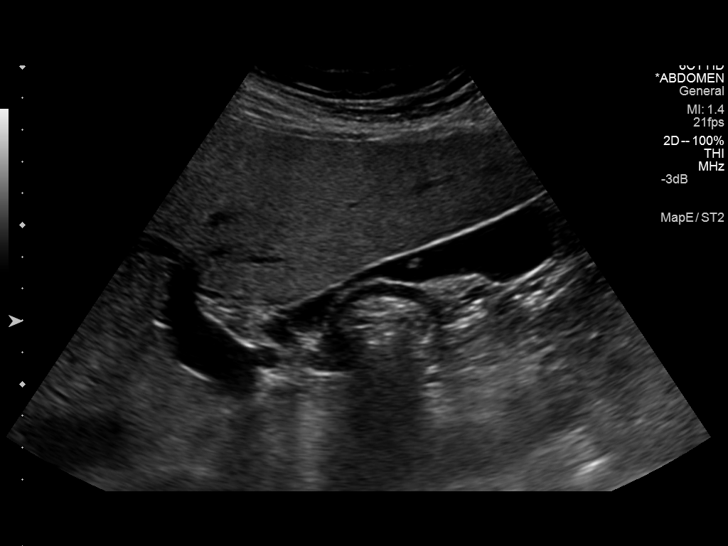
[im 76/92]
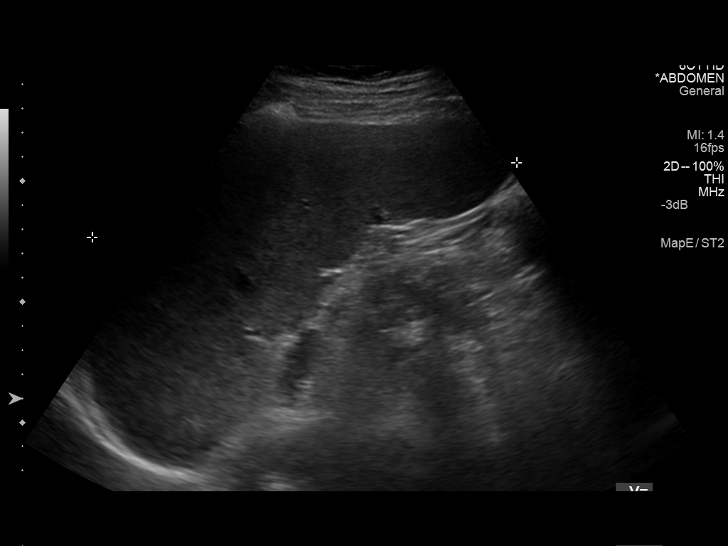
[im 84/92]
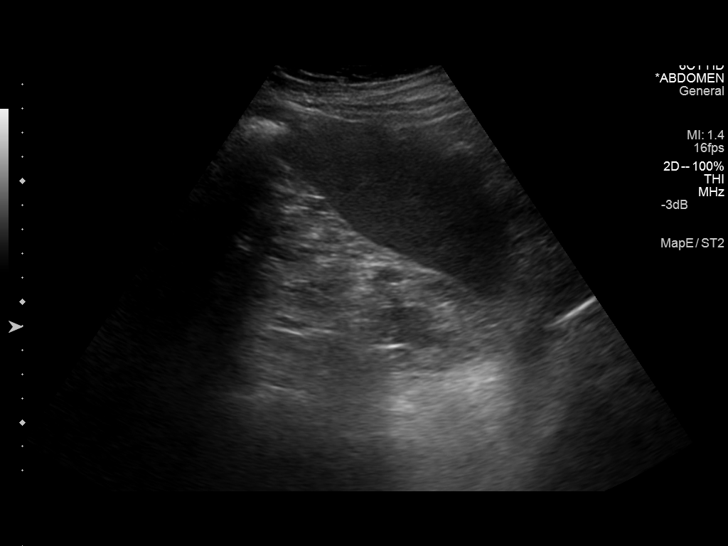
[im 92/92]
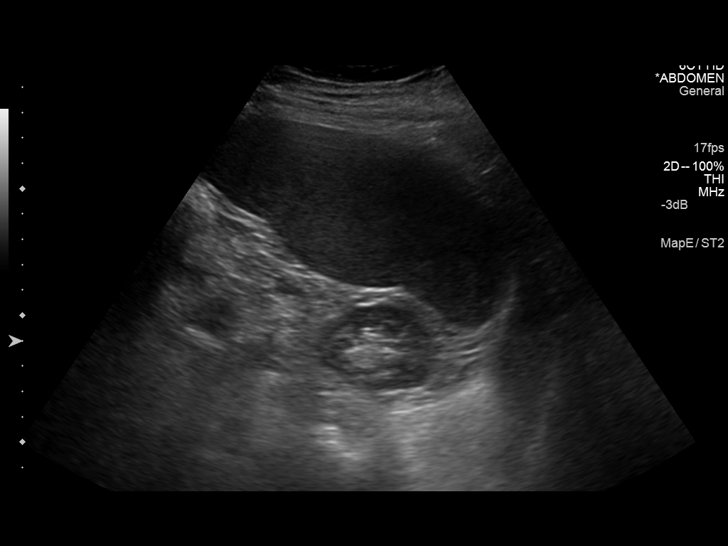

[13 of 25 positions shown; findings below may reference images not displayed]

FINDINGS: Gallbladder: Mobile dependent stones or sludge balls, largest 6 mm,
without gallbladder wall thickening, pericholecystic fluid, or
sonographic Murphy sign.

Common bile duct: Diameter: 5 mm

Liver: Hepatic increased echogenicity likely indicates underlying
steatosis without focal abnormality or intrahepatic ductal
dilatation.

IVC: No abnormality visualized.

Pancreas: Visualized portion unremarkable.

Spleen: Splenomegaly noted, volume 0237 cubic cm.

Right Kidney: Length: 11.9 cm. Echogenicity within normal limits. No
mass or hydronephrosis visualized.

Left Kidney: Length: 11.6 cm. Echogenicity within normal limits. No
mass or hydronephrosis visualized.

Abdominal aorta: No aneurysm visualized.

Other findings: None.
IMPRESSION: Hepatic increased echogenicity likely indicates underlying steatosis
without focal abnormality or intrahepatic ductal dilatation.

Splenomegaly.

Mobile gallstones or sludge balls without other sonographic evidence
for acute cholecystitis.

## 2016-08-07 ENCOUNTER — Ambulatory Visit: Payer: Managed Care, Other (non HMO) | Admitting: Sports Medicine

## 2016-09-03 ENCOUNTER — Encounter: Payer: Self-pay | Admitting: Sports Medicine

## 2016-09-03 ENCOUNTER — Ambulatory Visit (INDEPENDENT_AMBULATORY_CARE_PROVIDER_SITE_OTHER): Payer: Managed Care, Other (non HMO) | Admitting: Sports Medicine

## 2016-09-03 DIAGNOSIS — M1611 Unilateral primary osteoarthritis, right hip: Secondary | ICD-10-CM | POA: Diagnosis not present

## 2016-09-03 NOTE — Assessment & Plan Note (Signed)
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

## 2016-09-03 NOTE — Progress Notes (Signed)
CC: Right hip and lateral leg pain  HPI: Recently seen for evaluation for R hip replacement with [facility] in [place] (scheduled for 10/09/16) and had a lumbar radiograph at the time which she reports shows L4/L5 disk slippage. Does not have imaging with her today. She presents for concerns that her hip pain and lateral leg pain are related to the lumbar findings and would like recommendations to prevent further complications. Leg pain starts along distal right thigh and extending down to ankle.   She reports extensive family history of hip replacements in late 40's to early 50's. She denies any hip trauma or other inciting factors.  Past medical: Left hip replacement 2014  Review of systems: No weakness or numbness No nighttime awakenings No sciatic type radiation   Physical exam: Blood pressure 135/73, pulse 84, height 5\' 4"  (1.626 m), weight 175 lb (79.4 kg).  Overweight female in no acute distress.  Normal back flexion and extension without pain Bilateral varicosities below knees No leg length discrepancy  Normal gait  Right hip: Non tender to palpation Internal rotation limited to 5-10 degrees. External rotation 25 degrees. Good strength except for profound weakness of R hip abductors Negative straight leg raise Negative Tinel's   Plan:  Pain likely secondary to hip osteoarthritis. No radicular findings on physical exam. Given limited ROM of R hip, likely altering gait to cause increased pressure and pain along lateral leg.  Pain may improve after hip replacement (10/09/16) Exercises to improve hip abductors

## 2017-02-27 ENCOUNTER — Ambulatory Visit: Payer: Self-pay

## 2017-02-27 ENCOUNTER — Ambulatory Visit (INDEPENDENT_AMBULATORY_CARE_PROVIDER_SITE_OTHER): Payer: 59 | Admitting: Sports Medicine

## 2017-02-27 VITALS — BP 139/81 | Ht 64.0 in | Wt 177.0 lb

## 2017-02-27 DIAGNOSIS — M25511 Pain in right shoulder: Secondary | ICD-10-CM | POA: Diagnosis not present

## 2017-02-27 DIAGNOSIS — M7530 Calcific tendinitis of unspecified shoulder: Secondary | ICD-10-CM | POA: Diagnosis not present

## 2017-02-27 DIAGNOSIS — M25512 Pain in left shoulder: Secondary | ICD-10-CM

## 2017-02-27 NOTE — Assessment & Plan Note (Signed)
WE will try conservative care  HEP, stretching and massage  Candidate for barbotage procedure if not improving  Could also try NtG protocol  Reck 6 wks

## 2017-02-27 NOTE — Progress Notes (Signed)
  HPI: [name] [name] is a 56yo presenting with R shoulder pain since Jan. First noticed pain when she was playing tennis w/ her dog. Pain is located on the anterior part of the shoulder, worse with adduction and internal rotation. Saw an orthopedist who did an XR that showed calcific tendinopathy of supraspinatus muscle. She received a cortisone injection that gave her some pain relief. Presenting today looking because of the duration of pain, wondering if there are exercises/stretches available that can help so that she can avoid further injections or surgery. Is active with yoga, pilates, cooking, gardening.  Past Hx Has had bilat THR from OA  Soc Hx : ex smoker > 10 yrs  ROS Denies neck pain No radiciular sxs into arm  Objective: Vitals:   02/27/17 1109  Weight: 177 lb (80.3 kg)  Height: 5\' 4"  (1.626 m)  BMI: 30.38 BP: 139/81  Gen: 56yo female, sitting comfortably with arm at side R shoulder: No obvious swelling, erythema. No tenderness to palpation over the joint. Full ROM to forward flexion, extension, abduction, external rotation. Slightly decreased ROM and pain with adduction and internal rotation. Pain lifting arms over head. 5/5 strength at biceps, deltoid,ER . 4/5 supraspinatus strength and pain with resistance to SST testing + impingement tests - cause pain but not weak  XRay reviewed: dense calcifications in di  Ultrasound of RT shoulder:  Biceps tendon normal on short and long axis Subcapularis normal Infraspinatus has some distal calcification Teres minor normal Several calcifications visible at supraspinatus tendon.  These range from dense to halo type. No associated tear of tendon noted. Doppler flow normal  Arthritis visible at acromioclavicular joint.with narrowing and calcification  Impression:  Calcific supraspinatus tendinopathy;  AC joint arthritis.  Ultrasound and interpretation by Wolfgang Phoenix. Courteny Egler, MD   Assessment: [name] [name] is a [phone] presenting  with shoulder pain likely from both arthritis and calcific tendinopathy of the supraspinatus. Based on history and exam, it seems that the calcific changes are causing more pain for her.  Plan: - Provided handout and instructions for exercises and stretches for shoulder - Continue yoga and pilates, avoid exercises that cause pain - Provided names of chiropractors that can possibly help with massages/adjunctive therapies for symptomatic relief - Return in 6 weeks to see if exercises/stretches helped. At that time if pain persists, can consider injections to attempt to dissolve the calcium deposits   Stefanie Libel, MD

## 2017-04-15 ENCOUNTER — Ambulatory Visit (INDEPENDENT_AMBULATORY_CARE_PROVIDER_SITE_OTHER): Payer: 59 | Admitting: Sports Medicine

## 2017-04-15 ENCOUNTER — Encounter: Payer: Self-pay | Admitting: Sports Medicine

## 2017-04-15 VITALS — BP 126/76 | Ht 64.0 in | Wt 177.0 lb

## 2017-04-15 DIAGNOSIS — M7530 Calcific tendinitis of unspecified shoulder: Secondary | ICD-10-CM

## 2017-04-15 MED ORDER — NITROGLYCERIN 0.2 MG/HR TD PT24: MEDICATED_PATCH | TRANSDERMAL | 1 refills | Status: DC

## 2017-04-15 NOTE — Assessment & Plan Note (Signed)
She has significant calcium formation within the supraspinatus. We'll try conservative measures until performing barbotage. - Nitroglycerin therapy - She will ask her Pilates instructor to see if she feels comfortable rehabbing her shoulders. If not she can call back and could consider formal physical therapy referral.

## 2017-04-15 NOTE — Patient Instructions (Signed)

## 2017-04-15 NOTE — Progress Notes (Signed)
  [name] [name] - 56 y.o. female MRN [id number]  Date of birth: May 01, 1961  SUBJECTIVE:  Including CC & ROS.   [name] [name] is a 56 year old female is presenting with bilateral shoulder pain. The right shoulder is worse in the left shoulder. She had an ultrasound was performed last time showing calcific supraspinatus on the right shoulder. She reports she has had a start using ibuprofen 400 mg to 600 mg twice daily. The pain has gotten worse over the past couple weeks. The pain is in the lateral aspect of her shoulder. She denies any injury in the interim. She did receive an injection at an orthopedist office earlier this year which she thinks may have helped. She has not been able to perform her home exercises for her shoulders and a regular basis due to being busy with travel.  ROS: No unexpected weight loss, fever, chills, swelling, instability, numbness/tingling, redness, otherwise see HPI   HISTORY: Past Medical, Surgical, Social, and Family History Reviewed & Updated per EMR.   Pertinent Historical Findings include: PMHx: Right shoulder pain  Surgical:  Status post left and right total hip arthroplasty  Social:  No tobacco use   DATA REVIEWED: Previous ultrasound showing calcific supraspinatus tendinitis and acromial clavicular joint arthritis on the right  PHYSICAL EXAM:  VS: BP 126/76   Ht 5\' 4"  (1.626 m)   Wt 177 lb (80.3 kg)   BMI 30.38 kg/m  PHYSICAL EXAM: Gen: NAD, alert, cooperative with exam, well-appearing HEENT: clear conjunctiva, EOMI CV:  no edema, capillary refill brisk,  Resp: non-labored, normal speech Skin: no rashes, normal turgor  Neuro: no gross deficits.  Psych:  alert and oriented MSK:  Shoulders:  Some tenderness to palpation over the acromioclavicular joint on the right and not on the left. No overlying erythema. No signs of atrophy. Normal active flexion and abduction bilaterally. Normal external rotation bilaterally. Normal range of motion with  abduction and external rotated. Normal strength to resistance with internal and external rotation bilaterally. Some pain with empty can testing with more significant on the right than the left. Negative Hawkin's test on left and positive on the right. Neurovascular intact.   ASSESSMENT & PLAN:   Calcific supraspinatus tendinitis She has significant calcium formation within the supraspinatus. We'll try conservative measures until performing barbotage. - Nitroglycerin therapy - She will ask her Pilates instructor to see if she feels comfortable rehabbing her shoulders. If not she can call back and could consider formal physical therapy referral.

## 2017-05-27 ENCOUNTER — Ambulatory Visit (INDEPENDENT_AMBULATORY_CARE_PROVIDER_SITE_OTHER): Payer: 59 | Admitting: Sports Medicine

## 2017-05-27 ENCOUNTER — Encounter: Payer: Self-pay | Admitting: Sports Medicine

## 2017-05-27 DIAGNOSIS — M7530 Calcific tendinitis of unspecified shoulder: Secondary | ICD-10-CM

## 2017-05-27 NOTE — Progress Notes (Signed)
     Subjective:    Patient ID: [name] [name] , female   DOB: 1961/11/02 , 56 y.o..   MRN: [id number]  HPI  [name] [name] is here for:  1. Shoulder Pain follow up: Patient is that she has intermittent pain in the left shoulder but does not really bother her that much. She has constant pain in the right shoulder. Patient notes that she has been on 6 weeks total of Nitropatch therapy on the right shoulder. Over the last 2 weeks she has noticed some improvement. Continues to take 400 mg ibuprofen daily. Notes that opening jars and "pushing down" with her right hand are the most painful movements for her. She also has pain when reaching things overhead. Admits to some right upper extremity weakness at times. States that she used to be woken up from sleep quite often because it hurt shoulder pain but now it is becoming less frequent. She does Pilates and yoga regularly additionally she has been doing her home exercises for her right shoulder. Denies any numbness or tingling.   Review of Systems: Per HPI.  No neck pain; no radicular sxs.   Past Medical History: Patient Active Problem List   Diagnosis Date Noted  . Calcific supraspinatus tendinitis 02/27/2017  . Osteoarthritis of right hip 09/03/2016  . Abdominal pain, epigastric 08/11/2014  . Microcytic anemia 08/11/2014  . Expected blood loss anemia 10/20/2013  . Overweight (BMI 25.0-29.9) 10/20/2013  . 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 ABDOMINAL PAIN, GENERALIZED 03/22/2008  . URI 11/26/2007  . CONTUSION, RIGHT FOOT 07/14/2007  . RHINITIS, ALLERGIC 01/08/2007    Medications: reviewed   Social Hx:  reports that she quit smoking about 31 years ago. She has never used smokeless tobacco.   Objective:   BP 120/84   Ht 5\' 4"  (1.626 m)   Wt 178 lb (80.7 kg)   BMI 30.55 kg/m  Physical Exam  Gen: NAD, alert, cooperative with exam, well-appearing  Right Shoulder: Inspection reveals no abnormalities, atrophy or asymmetry. Palpation  showing some tenderness over AC joint but none over bicipital groove. ROM is full in all planes. Rotator cuff strength normal throughout. Positive empty can sign Neurovascularly intact   Assessment & Plan:  Calcific supraspinatus tendinitis Right shoulder pain improving after 6 weeks of Nitropatch therapy, yoga, Pilates, and home exercises.  -Continue Nitropatch therapy for the next 6 weeks -Continue ibuprofen or Tylenol when necessary -Continue home exercises -Follow-up in 6 weeks. We will repeat right shoulder ultrasound at that time to see if the calcification formation within the supraspinatus has changed at all  Smitty Cords, MD [place], PGY-3  I observed and examined the patient with the resident and agree with assessment and plan.  Note reviewed and modified by me. Stefanie Libel, MD

## 2017-05-27 NOTE — Assessment & Plan Note (Addendum)
Right shoulder pain improving after 6 weeks of Nitropatch therapy, yoga, Pilates, and home exercises.  -Continue Nitropatch therapy for the next 6 weeks -Continue ibuprofen or Tylenol when necessary -Continue home exercises -Follow-up in 6 weeks. We will repeat right shoulder ultrasound at that time to see if the calcification formation within the supraspinatus has changed at all

## 2017-07-17 ENCOUNTER — Ambulatory Visit: Payer: 59 | Admitting: Sports Medicine

## 2017-08-12 ENCOUNTER — Ambulatory Visit (INDEPENDENT_AMBULATORY_CARE_PROVIDER_SITE_OTHER): Payer: 59 | Admitting: Sports Medicine

## 2017-08-12 ENCOUNTER — Ambulatory Visit: Payer: Self-pay

## 2017-08-12 VITALS — BP 126/84 | Ht 64.0 in | Wt 177.0 lb

## 2017-08-12 DIAGNOSIS — M25511 Pain in right shoulder: Secondary | ICD-10-CM

## 2017-08-12 DIAGNOSIS — G8929 Other chronic pain: Secondary | ICD-10-CM

## 2017-08-12 MED ORDER — AMITRIPTYLINE HCL 25 MG PO TABS: 25.0000 mg | ORAL_TABLET | Freq: Every day | ORAL | 1 refills | Status: DC

## 2017-08-12 NOTE — Progress Notes (Signed)
Chief complaint: Acute on chronic right shoulder pain 1 month  History of present illness: [name] [name] is a 56 year old female who presents to the sports medicine office today with chief complaint of right shoulder pain. She does have known history of right calcific supraspinatus tendon, has been dealing with this for the last 9 months. Her last office visit here was back on 05/27/17. She reports over the last month or so noticing interval worsening of her right shoulder pain. She reports that her mother did become sick, she had to take over more caregiver duties and did have to travel to [place], [place] more to take care of her mother. She does not report of any inciting incident, trauma, or injury otherwise to explain the pain that she is having. She is right hand dominant. She reports having pain with abduction of her right arm and shoulder, as well as reaching across and grabbing for objects.  Today, she rates the pain as a 2/10. She does not report of any numbness or tingling. She reports taking Tylenol and ibuprofen for pain. She reports that approximately 2 times a week she does have to cut a 5 mg oxycodone tablet and half and take it for pain, as she notices having increased pain at nighttime. She had this as a leftover prescription from a previous surgery last year. She reports that she has been using the nitroglycerin patch daily. She reports pain in a circumferential area over the right anterior and lateral shoulder near the humeral head. She has been doing yoga and Pilates, as well as her home physical therapy exercises.  Review of systems:  As stated above  Interval past medical history, surgical history, family history, and social history obtained and unchanged.  Physical exam: Vital signs are reviewed and are documented in the chart Gen.: Alert, oriented, appears stated age, in no apparent distress HEENT: Moist oral mucosa Respiratory: Normal respirations, able to speak in full  sentences Cardiac: Regular rate, distal pulses 2+ Integumentary: No rashes on visible skin:  Neurologic: She does have weakness with supraspinatus rotator cuff strength, with categorizes as 4+/5, otherwise strength 5/5, sensation 2+ in bilateral upper extremities Psych: Normal affect, mood is described as good Musculoskeletal: Inspection of right shoulder reveals no obvious deformity or muscle atrophy, no warmth, erythema, ecchymosis, or effusion noted, she is tender to palpation over the supraspinatus muscle belly, as well as from the posterior aspect of the supraspinatus muscle, no tenderness to palpation over the distal clavicle, AC joint, acromion, head of biceps, scapular spine, and cervical spine, she does have full range of motion with forward flexion, abduction, external, and internal range of motion, no crepitus heard with any arc of range of motion, impingement testing positive with cross arm and empty can... Neer, speed, Yergason negative  Limited musculoskeletal ultrasound was performed of the right shoulder today. She does have normal biceps tendon, she does have normal subscapularis tendon, do see areas of calcification, particularly 3 areas of calcification along the distal supraspinatus tendon which seemed to be unchanged from last ultrasound in July, do see a small area of hypoechogenicity, question whether she has a microtear of the supraspinatus in the midportion, she does have normal infraspinatus tendon, normal teres minor tendon, she does have AC osteoarthritic changes with mushroom sign seen  Impression: Calcific supraspinatus tendon, with questionable microtear of the supraspinatus muscle; AC osteoarthritis  Assessment and plan: 1. 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 Right AC osteoarthritis  Right shoulder  pain -Discussed with patient ultrasound findings today, redemonstration of the calcific supraspinatus  tendon, discussed with her question of a microtear of the supraspinatus muscle, but no definitive partial tearing -Discussed to continue with nitroglycerin therapy, continue with Tylenol and ibuprofen use, continue with home exercise program, Pilates, yoga -Discussed aggressive cryotherapy -Will add on amitriptyline 25 mg nightly, discussed not to use ibuprofen at night, to use amitriptyline instead -If no improvement in her symptoms, discussed that barbotage be next step to take, did give her realistic expectations that this would only give her 25% success rate given that she does have a hard calcific area  Will plan have her come her back in 6 weeks for reevaluation and repeat ultrasound at that time or sooner as needed.    Mort Sawyers, M.D. [place]

## 2017-10-09 ENCOUNTER — Other Ambulatory Visit: Payer: Self-pay

## 2017-10-09 ENCOUNTER — Other Ambulatory Visit: Payer: Self-pay | Admitting: Sports Medicine

## 2017-10-09 ENCOUNTER — Ambulatory Visit: Payer: 59 | Admitting: Sports Medicine

## 2017-10-09 DIAGNOSIS — M7530 Calcific tendinitis of unspecified shoulder: Secondary | ICD-10-CM | POA: Diagnosis not present

## 2017-10-09 MED ORDER — AMITRIPTYLINE HCL 25 MG PO TABS: 25.0000 mg | ORAL_TABLET | Freq: Every day | ORAL | 11 refills | Status: DC

## 2017-10-10 ENCOUNTER — Encounter: Payer: Self-pay | Admitting: Sports Medicine

## 2017-10-10 NOTE — Progress Notes (Signed)
CC; RT shoulder pain  Follow up of calcific SST Treatment since 4/18 Dense calcific deposit in RT SST On last visit 10/2 we started amitriptyline at HS Within 3 weeks had less pain and sleeping better at night She also started CBD oil and thinks that has helped  Now pain level is 1 to 2 most days Some days no pain Better motion on ADLs and in Yoga class  Soc Hx: Mother with cancer and has been doing a lot of driving  ROS No neck pain No radicular sxs  PE Pleasant F in NAD BP 122/90   Ht 5\' 4"  (1.626 m)   Wt 178 lb (80.7 kg)   BMI 30.55 kg/m   Shoulder: Lt Inspection reveals no abnormalities, atrophy or asymmetry. Palpation is normal with no tenderness over AC joint or bicipital groove. ROM is full in all planes x she has limited back scratch on RT Rotator cuff strength normal throughout. Mild signs of impingement with Neer and Hawkin's tests, empty can. Primarily feels slight pain Speeds and Yergason's tests normal. No labral pathology noted with negative Obrien's, negative clunk and good stability. Normal scapular function observed. No painful arc and no drop arm sign. No apprehension sign

## 2017-10-10 NOTE — Assessment & Plan Note (Signed)
Much improved Seems amitriptyline helps and part may be that she is sleeping Cont 25 at HS Cont CBD oil  ROM exercises and RC exercises - we modified to keep in pain free ROM OK to cont yoga and pilates  Reck 3 mos

## 2017-12-25 ENCOUNTER — Ambulatory Visit: Payer: 59 | Admitting: Sports Medicine

## 2018-01-01 ENCOUNTER — Other Ambulatory Visit: Payer: Self-pay | Admitting: Sports Medicine

## 2018-02-03 ENCOUNTER — Ambulatory Visit: Payer: 59 | Admitting: Sports Medicine

## 2018-04-14 ENCOUNTER — Encounter: Payer: Self-pay | Admitting: Sports Medicine

## 2018-04-14 ENCOUNTER — Ambulatory Visit (INDEPENDENT_AMBULATORY_CARE_PROVIDER_SITE_OTHER): Payer: Private Health Insurance - Indemnity | Admitting: Sports Medicine

## 2018-04-14 VITALS — BP 114/78 | Ht 64.5 in | Wt 175.0 lb

## 2018-04-14 DIAGNOSIS — M7530 Calcific tendinitis of unspecified shoulder: Secondary | ICD-10-CM | POA: Diagnosis not present

## 2018-04-14 MED ORDER — METHYLPREDNISOLONE ACETATE 40 MG/ML IJ SUSP
40.0000 mg | Freq: Once | INTRAMUSCULAR | Status: AC
  Administered 2018-04-14: 40 mg via INTRA_ARTICULAR

## 2018-04-14 NOTE — Progress Notes (Signed)
Chief complaint: Follow-up of right shoulder pain x7 months  History of present illness: [name] [name] is a 57 year old right-hand-dominant female presents to the sports medicine office today for follow-up of right shoulder pain.  She does have known history of calcific supraspinatus tendinopathy.  She was last here in the office about 6 months ago.  Back in October 2018, she was started on nitroglycerin as well as amitriptyline 25 mg nightly.  This has helped out with her symptoms and she has done quite well with this.  She has been intermittently taken on caregiver duties for her mother who has cancer down in [place], [place].  Unfortunately, she reports that back in the beginning of 03/29/2023 her mother passed away and she had to stop with doing yoga, Pilates, as well as home exercise program and since that time is had worsening of right shoulder pain.  She points to the anterior aspect of her right shoulder as well as her proximal arm in the biceps region as to where she feels pain.  She reports pain specifically with shoulder flexion, shoulder abduction, as well as shoulder extension.  She reports occasionally feeling pain in her hands and fingers at nighttime.  She does not report of any numbness, tingling, burning paresthesias.  She does not report of any interval injury or trauma.  She has been using the nitroglycerin patch daily, has not noticed much of a difference now with amitriptyline other than making her be able to go back to sleep at nighttime.  She has also been using combination of CBD oil sublingually as well as Arnica gel.  She describes the pain today as an aching, throbbing, and occasionally sharp pain.  She does not report any radiation of pain coming from her shoulder all the way down her arm to her hand and fingers.  She does report of pain waking her up from sleep at nighttime, specifically if she were to lay on the right side.  Review of systems:  As stated above  Her past medical  history, surgical history, family history, and social history obtained and unchanged.  Her past medical history is notable for GERD, hiatal hernia, history of microcytic anemia, and anxiety; surgical history notable for cholecystectomy, left THA, and C-section; she does not report of any current tobacco use; family history notable for breast cancer; allergies and medications are reviewed and are reflected in EMR.  Physical exam: Vital signs are reviewed and are documented in the chart Gen.: Alert, oriented, appears stated age, in no apparent distress HEENT: Moist oral mucosa Respiratory: Normal respirations, able to speak in full sentences Cardiac: Regular rate, distal pulses 2+ Integumentary: No rashes on visible skin:  Neurologic: She does have intact rotator cuff strength on the right side, would categorize strength as 5/5, she also has intact biceps strength on the right side, would also categorize this as 5/5, sensation 2+ in bilateral upper extremities Psych: Normal affect, mood is described as good Musculoskeletal: Inspection of her right shoulder reveals no obvious deformity or muscle atrophy, no warmth, erythema, ecchymosis, or effusion, she is tender palpation over the superior aspect of the right shoulder over the distal supraspinatus rotator cuff insertion over the humeral head, slight tenderness over the deltoid as well, she does have pain with abduction and forward flexion, specifically with getting to 110 degrees of both forward flexion and abduction, she does have limited internal range of motion, only able to get to L2 on the right side, and the mid thoracic spine on  the left side, with arms abducted to side she does have full internal and external range of motion, rotator cuff impingement testing is grossly positive as empty can, Hawkins, Neer's, and O'Brien's are all positive, Speed, Yergason, and Spurling are all negative  Limited musculoskeletal ultrasound was performed today of  her right shoulder: -Unremarkable appearing biceps tendon -Unremarkable appearing subscapularis tendon -3 scattered areas of calcific changes noted along the distal supraspinatus, with slight evidence of neovascularization seen  -Unremarkable infraspinatus tendon -Unremarkable teres minor tendon impression:  Impression: Redemonstration of calcific supraspinatus tendinopathy  Ultrasound performed and interpreted by Mort Sawyers, MD and Dyke Brackett MD  Procedure:  After written informed consent signed and obtained, and benefit of pain relief and risk of bleeding, infection, and steroid flare discussed, [name] [name] agreed to proceed with cortisone injection into the right subacromial bursa. After timeout, area  was cleaned with Betadine and alcohol wipes. Ethyl chloride was used as topical anesthetic spray. Using 5 cc 1% lidocaine without epinephrine and 1 cc of 40 mg Depo-Medrol on a 22-gauge 1.5 inch needle, her right subacromial bursa  was injected. She did not have any bleeding afterwards. No complications noted from procedure.  Assessment and plan: 1.  Chronic right shoulder pain, with ultrasound evidence of continued calcific supraspinatus tendinopathy  Plan: Discussed with [name] today that her symptoms seem to be continuation of calcific supraspinatus tendinopathy.  Suspect that stress is playing a role as she has had a recent stressors, specifically with her mother passing away.  Discussed continuing with nitroglycerin, but trying it every other day and seeing if she noticed any difference or not.  Uncertain how much interval improvement nitroglycerin is making.  If she has not noticed any changes, discussed to have her completely stop the nitroglycerin patch.  Will still have her continue with amitriptyline 25 mg nightly.  In addition, discussed today that cortisone injection to subacromial bursa will give her some relief and will jump start things with her being able to get more out a yoga  and Pilates.  She is agreeable to this, injection done as noted above without any complications noted.  Will plan to have her follow-up in 4 to 6 weeks for reevaluation or sooner as needed.   Mort Sawyers, M.D. [facility]  I observed and examined the patient with the [facility] Fellow[facility] and agree with assessment and plan.  Note reviewed and modified by me. Stefanie Libel, MD

## 2018-04-14 NOTE — Assessment & Plan Note (Signed)
CSI today HEP as before Cont amitriptyline  Reck 6 wks

## 2018-05-26 ENCOUNTER — Ambulatory Visit: Payer: Private Health Insurance - Indemnity | Admitting: Sports Medicine

## 2018-06-16 ENCOUNTER — Ambulatory Visit (INDEPENDENT_AMBULATORY_CARE_PROVIDER_SITE_OTHER): Payer: Private Health Insurance - Indemnity | Admitting: Sports Medicine

## 2018-06-16 ENCOUNTER — Encounter: Payer: Self-pay | Admitting: Sports Medicine

## 2018-06-16 VITALS — BP 126/70 | Ht 64.5 in | Wt 175.0 lb

## 2018-06-16 DIAGNOSIS — M7530 Calcific tendinitis of unspecified shoulder: Secondary | ICD-10-CM | POA: Diagnosis not present

## 2018-06-16 NOTE — Progress Notes (Signed)
   HPI  CC: Right shoulder pain  [name] [name] is a 58 year old female who presents today for follow-up of right shoulder pain.  Was last seen on June 4 of this year.  At that time she was given a subacromial steroid injection and was provided with some stretches to work on for shoulder pain.  She was also given a nitro patch as well as amitriptyline 25 mg for nighttime use.  Since that time she states she has had vast improvement in the pain in her shoulder.  She has been doing Pilates twice a week without any impairment.  She states that she has had increased range of motion shoulder following the injection.  She states she still has some mild pain from time to time however.  She denies any weakness in her arm.  See HPI and/or previous note for associated ROS.  Objective: BP 126/70   Ht 5' 4.5" (1.638 m)   Wt 175 lb (79.4 kg)   BMI 29.57 kg/m  Gen: Right-Hand Dominant. NAD, well groomed, a/o x3, normal affect.  CV: Well-perfused. Warm.  Resp: Non-labored.  Neuro: Sensation intact throughout. No gross coordination deficits.   Right shoulder exam: No erythema, warmth, swelling.  Tenderness to palpation over the [facility] joint.  Patient with 130 degrees in active forward flexion of arm and 120 degrees of active abduction.  She was able to have full range of motion, with pain, and passive range of motion.  5 out of 5 strength throughout all testing.  Pain with empty can test.  Positive crossover test.  Negative speeds test, negative Neer's, negative Hawkins, negative apprehension.  ULTRASOUND: Shoulder, right Diagnostic limited ultrasound imaging obtained of patient's right shoulder.  - No obvious evidence of bony deformity or osteophyte development appreciated.  - Long head of the biceps tendon: No evidence of tendon thickening, calcification, subluxation, or tearing in short or long axis views. No edema or bullseye sign.  - Supraspinatus tendon: complete visualization across the width of the insertion  point yielded evidence calcification at the distal attachment, improved from previous exam in June.  There were no obvious tears noted in the long axis view.  IMPRESSION: findings consistent with healing calcific supraspinatus tendinopathy.  Abnormalities found, note neovessels:  "  Doppler showed evidence of neovascularization  "   Assessment and plan: Calcific supraspinatus tendinopathy, improved today on ultrasound.  [name] [name] seems to be improving with Nitropatch and amitriptyline at nighttime.  Steroid injection she got the last visit seems to be giving her great relief.  She does not seem to be impaired with her day-to-day activity, and is able to do Pilates still.  At this time I advised her to continue use the Nitropatch and amitriptyline 25 mg at nighttime.  Calcifications were likely improved from the steroid injection at last visit.  We will see her back in 1 to 2 months for follow-up to evaluate improvement on Nitropatch.  Lewanda Rife, MD [facility] Fellow 06/16/2018 1:38 PM  I observed and examined the patient with the [facility] Fellow and agree with assessment and plan.  Note reviewed and modified by me. Stefanie Libel, MD

## 2018-08-18 ENCOUNTER — Ambulatory Visit (INDEPENDENT_AMBULATORY_CARE_PROVIDER_SITE_OTHER): Payer: Private Health Insurance - Indemnity | Admitting: Sports Medicine

## 2018-08-18 ENCOUNTER — Encounter: Payer: Self-pay | Admitting: Sports Medicine

## 2018-08-18 VITALS — BP 120/82 | Ht 64.25 in | Wt 176.0 lb

## 2018-08-18 DIAGNOSIS — M7531 Calcific tendinitis of right shoulder: Secondary | ICD-10-CM

## 2018-08-18 NOTE — Patient Instructions (Signed)
Thank you for coming to see [place] today in clinic.  You were reevaluated today for the calcific rotator cuff tendinitis in your right shoulder.  You are doing well currently on your Nitropatch and amitriptyline.  We will continue with this treatment at this time.  We will follow you up in 3 to 4 months.

## 2018-08-18 NOTE — Progress Notes (Addendum)
   HPI  CC: Right shoulder pain  [name] [name] is a 57 year old female presents today for follow-up of right shoulder pain.  She was last seen on 06/16/2018 for the shoulder pain.  She had multiple scans that show calcific changes in her supraspinatus on the right shoulder.  She had a steroid injection done in June of last year which seemed to resolve the pain.  She has since been on a nitroglycerin patch as well as amitriptyline 25 mg which she states has improved the pain as well.  She states she sometimes still feels the pain, but is usually around 1-2 out of 10.  She states her pain is usually with overhead activity.  She states the pain does not bother her at night anymore.  She has no numbness tingling on her arm.  She has no weakness of her arm.  He has no new injury to the area.  She states she is been doing Pilates and yoga, as well as her range of motion exercises of her shoulder.  She has no difficulty in these exercises.  See HPI and/or previous note for associated ROS.  Objective: BP 120/82   Ht 5' 4.25" (1.632 m)   Wt 176 lb (79.8 kg)   BMI 29.98 kg/m  Gen: Right-Hand Dominant. NAD, well groomed, a/o x3, normal affect.  CV: Well-perfused. Warm.  Resp: Non-labored.  Neuro: Sensation intact throughout. No gross coordination deficits.  Gait: Nonpathologic posture, unremarkable stride without signs of limp or balance issues.  Right shoulder exam: No erythema, warmth, swelling noted.  No tennis palpation noted on exam.  Full range of motion in forward flexion, abduction, external and internal rotation.  Some some pain noted in abduction.  5 out of 5 strength throughout all testing.  Positive empty can test, negative speeds test, negative crossover test, negative Neer's test, negative Hawkins test, negative O'Brien's test.  ULTRASOUND: Shoulder, right  Diagnostic limited ultrasound imaging obtained of patient's right shoulder.   - Supraspinatus tendon: complete visualization across the width  of the insertion point yielded   Calcification at the distal attachment in the long axis view.  This a consistent finding with the scan from early August of this year.  No evidence of bursal inflammation appreciated.   IMPRESSION: findings consistent with stable calcific supraspinatus tendinopathy.  Assessment and plan: Calcific supraspinatus tendinopathy, improving.  Treatment plans were discussed today with [name] [name].  She seems to be doing well on the Nitropatch with amitriptyline 25 mg.  We can continue with this treatment plan moving forward.  She should continue with her yoga and Pilates, as well as her rotator cuff range of motion exercises.  We will see her back in 3 mos to reevaluate, as long she has no worsening of symptoms during that interval.  Lewanda Rife, MD [facility] Fellow 08/18/2018 10:50 AM  I observed and examined the patient with the [facility] Fellow and agree with assessment and plan.  Note reviewed and modified by me. Ila Mcgill, MD

## 2018-08-30 ENCOUNTER — Other Ambulatory Visit: Payer: Self-pay | Admitting: Sports Medicine

## 2018-10-13 ENCOUNTER — Encounter: Payer: Self-pay | Admitting: Sports Medicine

## 2018-10-13 ENCOUNTER — Ambulatory Visit: Payer: Self-pay

## 2018-10-13 ENCOUNTER — Ambulatory Visit (INDEPENDENT_AMBULATORY_CARE_PROVIDER_SITE_OTHER): Payer: Private Health Insurance - Indemnity | Admitting: Sports Medicine

## 2018-10-13 VITALS — BP 124/86 | Ht 64.0 in | Wt 175.0 lb

## 2018-10-13 DIAGNOSIS — M25511 Pain in right shoulder: Secondary | ICD-10-CM

## 2018-10-13 DIAGNOSIS — M7531 Calcific tendinitis of right shoulder: Secondary | ICD-10-CM

## 2018-10-13 DIAGNOSIS — M7530 Calcific tendinitis of unspecified shoulder: Secondary | ICD-10-CM

## 2018-10-13 MED ORDER — METHYLPREDNISOLONE ACETATE 40 MG/ML IJ SUSP
40.0000 mg | Freq: Once | INTRAMUSCULAR | Status: AC
  Administered 2018-10-13: 40 mg via INTRA_ARTICULAR

## 2018-10-13 NOTE — Progress Notes (Signed)
HPI  CC: Right shoulder pain  [name] [name] is a 57 year old female who presents for follow-up of right shoulder pain.  She was last seen on 08/18/2018.  At that time she was having improvement in her pain.  She was continued on her amitriptyline and Nitropatch protocol.  Since that time, she had an acute injury to her shoulder where she was reaching behind her and felt a sharp pain in her shoulder.  She states the pain is been worse since that time.  She states she has pain with overhead activity.  She states the pain is worse at nighttime which lasted affected side.  She states she has been doing her home exercises she was given last visit.  She is continue with amitriptyline and Nitropatch protocol.  She denies any numbness and tingling in her arm.  She does say there is some pain radiating into her deltoid down to her fingers from time to time.  She denies any weakness of her hand.  See HPI and/or previous note for associated ROS.  Objective: BP 124/86   Ht 5\' 4"  (1.626 m)   Wt 175 lb (79.4 kg)   BMI 30.04 kg/m  Gen: Right-Hand Dominant. NAD, well groomed, a/o x3, normal affect.  CV: Well-perfused. Warm.  Resp: Non-labored.  Neuro: Sensation intact throughout. No gross coordination deficits.  Gait: Nonpathologic posture, unremarkable stride without signs of limp or balance issues.  Right shoulder exam: No erythema, warmth, swelling noted.  Tenderness palpation of the bicipital groove.  Full range of motion in forward flexion.  Full range of motion internal and external rotation.  Range of motion limited to around 100 degrees in abduction.  Full range of motion passively.  Strength 5 out of 5 throughout testing.  Positive empty can test, positive Hawkins test.  Negative speeds test, negative Yergason's test, negative crossover test, negative belly press off test, negative apprehension test, negative O'Brien's test.  ULTRASOUND: Shoulder, right Diagnostic limited ultrasound imaging obtained of  patient's right shoulder.  - No obvious evidence of bony deformity or osteophyte development appreciated.  - Long head of the biceps tendon: No evidence of tendon thickening, calcification, subluxation, or tearing in short or long axis views. No edema or bullseye sign.  - Subscapularis tendon: complete visualization across the width of the insertion point yielded no evidence of tendon thickening, calcification, or tears in the long axis view.  - Supraspinatus tendon: complete visualization across the width of the insertion point showed multiple levels of calcification through mid substance as well as the distal attachment.  No tears noted.  Hypoechoic changes noted around calcification in the mid substance. - Infraspinatus and teres minor tendons: visualization across the width of the insertion points yielded no evidence of tendon thickening, calcification, or tears in the long axis view.  IMPRESSION: findings consistent with calcific supraspinatus tendinosis.  Ultrasound and interpretation by [name] Rife, MD and  [name] [name]. [name] Fomby, MD   Assessment and plan: Calcific supraspinatus tendinopathy, right side  INJECTION: Patient was given informed consent, signed copy in the chart. Appropriate time out was taken. Area prepped and draped in usual sterile fashion. 2 cc of methylprednisolone 40 mg/ml plus  4 cc of 1% lidocaine without epinephrine was injected into the right subacromial space using a posterior lateral approach. The patient tolerated the procedure well. There were no complications. Post procedure instructions were given.  We discussed treatment options today with [name] [name].  I gave her a subacromial injection to her right shoulder as noted above.  We did discuss that she could do barbotage of the right supraspinatus muscle.  We have advised her to continue the Nitropatch protocol at this time.  We advised her to continue with her home exercises.  We will see her back in 3 months time.  She has any  worsening pain in the interim, she can call to schedule appointment earlier.  I will consider doing barbotage she has no improvement.   [name] Rife, MD [facility] Fellow 10/13/2018 12:07 PM   I observed and examined the patient with the [facility] Fellow and agree with assessment and plan.  Note reviewed and modified by me. Ila Mcgill, MD

## 2018-10-20 ENCOUNTER — Other Ambulatory Visit: Payer: Self-pay

## 2018-10-20 MED ORDER — AMITRIPTYLINE HCL 25 MG PO TABS: 25.0000 mg | ORAL_TABLET | Freq: Every day | ORAL | 6 refills | Status: DC

## 2019-01-12 ENCOUNTER — Ambulatory Visit: Payer: Private Health Insurance - Indemnity | Admitting: Sports Medicine

## 2019-01-19 ENCOUNTER — Other Ambulatory Visit: Payer: Self-pay | Admitting: Sports Medicine

## 2019-02-11 ENCOUNTER — Ambulatory Visit: Payer: Private Health Insurance - Indemnity | Admitting: Sports Medicine

## 2019-05-04 ENCOUNTER — Other Ambulatory Visit: Payer: Self-pay | Admitting: Sports Medicine

## 2019-05-14 ENCOUNTER — Other Ambulatory Visit: Payer: Self-pay | Admitting: Sports Medicine

## 2019-06-03 ENCOUNTER — Other Ambulatory Visit: Payer: Self-pay

## 2019-06-03 ENCOUNTER — Encounter: Payer: Self-pay | Admitting: Sports Medicine

## 2019-06-03 ENCOUNTER — Ambulatory Visit: Payer: Self-pay

## 2019-06-03 ENCOUNTER — Ambulatory Visit: Payer: 59 | Admitting: Sports Medicine

## 2019-06-03 VITALS — BP 142/78 | Ht 64.0 in | Wt 174.0 lb

## 2019-06-03 DIAGNOSIS — M7660 Achilles tendinitis, unspecified leg: Secondary | ICD-10-CM | POA: Diagnosis not present

## 2019-06-03 DIAGNOSIS — M7530 Calcific tendinitis of unspecified shoulder: Secondary | ICD-10-CM | POA: Diagnosis not present

## 2019-06-03 NOTE — Progress Notes (Addendum)
PCP: Aura Dials, PA-C  Subjective:   HPI: Patient is a 58 y.o. female here for right shoulder pain. 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 She notes she has been doing very well since her last steroid injection on 10/13/2018 alogn with the nitro patch and amitriptyline. However, she thinks she may have flared up her shoulder during pilates. She is beginning to have more pain at night along her anterolateral side of her arm. She is having trouble getting comfortable at night. She is also starting to get pain with certain weighted arm exercises that did not hurt prior. Denies any numbness but does endorse some occasional tingling in her right hand at night when the pain is bad. Denies any weakness.  Patient also endorses left lateral posterior heel pain x 6 weeks but has gotten worsen over the last 2 weeks where it aches at night. She notes most severe pain is first thing in the morning upon waking and prolonged sitting. Pain improves with ambulation. No reported trauma. She notes she is walking more but does not endorse excessive heals. She does note she walked a lot barefoot on the beach for several days.   Review of Systems:  Per HPI.   [place], medications and smoking status reviewed.      Objective:  Physical Exam:  Gen: awake, alert, NAD, comfortable in exam room Pulm: breathing unlabored  Foot:  Inspection:  No obvious bony deformity.  No swelling, erythema, or bruising.  Normal arch  Palpation: Tenderness along lateral posterior heal  ROM: Full  ROM of the ankle. Normal midfoot flexibility  Strength: 5/5 strength ankle in all planes  Neurovascular: N/V intact distally in the lower extremity  Gait WNL  Shoulder - full ROM.  Mild pain on empty can test and hawkins but good strength.  Ultrasound, Right Shoulder Findings: Hyperechoic densities noted in supraspinatous tendon, less dense than prior.  Bursa WNL.  No signs of tears in  supraspinatus. Impression: Chronic calcifications in supraspinatous tendon   Ultrasound and interpretation by Dr. Tarry Kos and Wolfgang Phoenix. Fields, MD   Ultrasound of achilles, left - achilles normal appearance, intact normal width - < 0.5 cm, no neovascularization - hyperechoic density consistent with spurring on lateral calcaneous  There is moderate hypoechoic swelling around the spur   Impression: bone spur on lateral calcaneous with swelling, No Achilles pathology noted  Proximal to this and no muscle tears in calf   Ultrasound and interpretation by Dr. Tarry Kos and Wolfgang Phoenix. Fields, MD   Assessment & Plan:  1. Calcific supraspinatus tendinitis: Chronic, acutely worsened. No signs of tears or acute inflammation on ultrasound. Patient had much success with last steroid shot in Dec 2019. Discussed treatment options including repeat cortisone shot today. Patient opted to hold off on steroid shot at this time given pain is tolerable. - continue current Nitro patches and Amitriptyline - RTC if worsens. Can consider steroid injection at that time.  2. Right achilles pain: Ultrasound notable for posterolateral calcaneous bone spur likely causing achilles tendinopathy. No tears or retrocalcaneal bursitis. Conservative treatment at this time including Nitro patches, eccentric achilles tendon exercises, and heal pads. Discussed avoiding walking barefoot. RTC in 4-6 weeks, sooner if worsening or no improvement.   Orders Placed This Encounter  Procedures  . [place] LIMITED JOINT SPACE STRUCTURES LOW LEFT    Standing Status:   Future    Number of Occurrences:   1    Standing Expiration Date:  08/01/2020    Order Specific Question:   Reason for Exam (SYMPTOM  OR DIAGNOSIS REQUIRED)    Answer:   achilles pain    Order Specific Question:   Preferred imaging location?    Answer:   Internal    Mina Marble, [place], PGY2 06/03/2019 4:49 PM  I observed and examined the patient with the  resident and agree with assessment and plan.  Note reviewed and modified by me. Ila Mcgill, MD  06/17/19 I called and advised that I think the spur/ calcification likely triggers the burning pain.  Key may be keeping pressure off this area.  Will continue exercises, icing and NTG patches. KBF

## 2019-06-03 NOTE — Assessment & Plan Note (Signed)
Chronic, acutely worsened. No signs of tears or acute inflammation on ultrasound. Patient had much success with last steroid shot in Dec 2019. Discussed treatment options including repeat cortisone shot today. Patient opted to hold off on steroid shot at this time given pain is tolerable. - continue current Nitro patches and Amitriptyline - RTC if worsens. Can consider steroid injection at that time.

## 2019-06-04 ENCOUNTER — Other Ambulatory Visit: Payer: Self-pay | Admitting: Sports Medicine

## 2019-07-27 ENCOUNTER — Ambulatory Visit: Payer: 59 | Admitting: Sports Medicine

## 2019-07-27 ENCOUNTER — Encounter

## 2019-08-11 ENCOUNTER — Other Ambulatory Visit: Payer: Self-pay | Admitting: Sports Medicine

## 2019-08-12 ENCOUNTER — Ambulatory Visit: Payer: 59 | Admitting: Sports Medicine

## 2019-08-12 ENCOUNTER — Other Ambulatory Visit: Payer: Self-pay

## 2019-08-12 VITALS — BP 122/82 | Ht 64.0 in | Wt 175.0 lb

## 2019-08-12 DIAGNOSIS — M7732 Calcaneal spur, left foot: Secondary | ICD-10-CM | POA: Insufficient documentation

## 2019-08-12 DIAGNOSIS — M7530 Calcific tendinitis of unspecified shoulder: Secondary | ICD-10-CM | POA: Diagnosis not present

## 2019-08-12 MED ORDER — METHYLPREDNISOLONE ACETATE 40 MG/ML IJ SUSP
40.0000 mg | Freq: Once | INTRAMUSCULAR | Status: AC
  Administered 2019-08-12: 16:00:00 40 mg via INTRA_ARTICULAR

## 2019-08-12 NOTE — Assessment & Plan Note (Signed)
Continue in boot, may gradually come off as tolerated. Transition to shoe with soft heel. Continue to wear heel lifts when in shoes. Eccentric exercises when she is able to tolerate them.  Return as needed.

## 2019-08-12 NOTE — Assessment & Plan Note (Addendum)
Chronic, currently with pain at night interfering with sleep.   Subacromial injection, right shoulder: Patient was given informed consent, signed copy in the chart. Appropriate time out was taken. Area prepped and draped in usual sterile fashion. 2 cc of methylprednisolone 40 mg/ml plus  4 cc of 1% lidocaine without epinephrine was injected into the right subacromial space using a posterior lateral approach. The patient tolerated the procedure well. There were no complications. Post procedure instructions were given.  Continue exercises for shoulder strengthening-- strength has improved from prior exam, less pain during the day.

## 2019-08-12 NOTE — Progress Notes (Signed)
[name] [name] - 58 y.o. female MRN [id number]  Date of birth: 07-08-1961  SUBJECTIVE:   CC: left achilles pain and right shoulder pain  Left achilles: Pain started in June. Seen last in our office on 7/23, noted to have posterolateral calcaneous bone spur causing achilles tendinopathy, provided heel pads, eccentric exercises. Had continued burning pain along posterior heel. Saw Dr. Tawni Millers, was placed in a boot with heel lifts x 4 weeks. Continued to use nitroglycerin patches. She reports that heel pain has improved significantly. She has started spending 2-3 hours out of boot at a time, slightly sore after.  She has been walking less but has been doing pilates and biking frequently.  Right shoulder: known calcific supraspinatus tendinopathy. She has been doing exercises given to her (rotator cuff series, avoiding extreme internal rotation or overhead as to not exacerbate impingement). She feels like she is getting stronger and is now able to use weights. Additionally, she feels like her range of motion has improved. She is now having worsening pain at night and it is impacting her sleep. Occasionally gets pain at dorsum of right hand as well with some tingling. No numbness. Denies weakness.   ROS: No muscle pain, numbness/tingling, redness, otherwise see HPI   PMHx - Updated and reviewed.  Contributory factors include: Negative PSHx - Updated and reviewed.  Contributory factors include:  Negative FHx - Updated and reviewed.  Contributory factors include:  Negative Social Hx - Updated and reviewed. Contributory factors include: Negative Medications - reviewed   DATA REVIEWED: Prior records   PHYSICAL EXAM:  VS: BP:122/82  HR: bpm  TEMP: ( )  RESP:   HT:5\' 4"  (162.6 cm)   WT:175 lb (79.4 kg)  BMI:30.02 PHYSICAL EXAM: Gen: NAD, alert, cooperative with exam, well-appearing HEENT: clear conjunctiva,  CV:  no edema, capillary refill brisk, normal rate Resp: non-labored Skin: no rashes,  normal turgor  Neuro: no gross deficits.  Psych:  alert and oriented  Right Shoulder: Inspection reveals no obvious deformity, atrophy, or asymmetry. No bruising. No swelling Palpation is normal with no TTP over anterior shoulder Full ROM in flexion, abduction, internal/external rotation NV intact distally Normal scapular function observed. Special Tests:  - Impingement: Positive Hawkins,  Neg empty can sign. - Supraspinatous: Negative empty can.  5/5 strength with resisted flexion at 20 degrees - Infraspinatous/Teres Minor: 5/5 strength with ER - Subscapularis: 5/5 strength with IR  Left foot/ankle Inspection:  Swelling noted medial to achilles tendon Palpation: TTP along lateral posterior calcaneus ROM: Full ROM of ankle and normal midfoot flexibility Strength: 5/5 in all planes NVI Normal gait  Right foot/ankle: Normal inspection No TTP Full ROM Strength normal NVI   Ultrasound of achilles, left -achilles normal appearance, intact, ~0.55 cm, no neovascularization - Superficial spurring on lateral calcaneous at insertion of achilles, mild amount of hypoechoic swelling around spur.   Impression: superficial bone spur on lateral calcaneous with mild swelling (reduced from prior scan), normal appearing achilles.  Ultrasound and interpretation byDr. Marcina Millard andKarl B. Fields, MD   ASSESSMENT & PLAN:   Calcific supraspinatus tendinitis Chronic, currently with pain at night interfering with sleep.   Subacromial injection, right shoulder: Patient was given informed consent, signed copy in the chart. Appropriate time out was taken. Area prepped and draped in usual sterile fashion. 2 cc of methylprednisolone 40 mg/ml plus  4 cc of 1% lidocaine without epinephrine was injected into the right subacromial space using a posterior lateral approach. The patient tolerated the procedure  well. There were no complications. Post procedure instructions were given.  Continue  exercises for shoulder strengthening-- strength has improved from prior exam, less pain during the day.  Bone spur of posterior portion of left calcaneus Continue in boot, may gradually come off as tolerated. Transition to shoe with soft heel. Continue to wear heel lifts when in shoes. Eccentric exercises when she is able to tolerate them.  Return as needed.  I did suggest we rescan the Calcaneal spur at 6 weeks from now.  We may do periodic CSI for calcific tendinopathy of SST. I observed and examined the patient with Dr. Mayer Masker and agree with assessment and plan.  Note reviewed and modified by me. Ila Mcgill, MD

## 2019-09-16 ENCOUNTER — Other Ambulatory Visit: Payer: Self-pay | Admitting: Sports Medicine

## 2019-09-27 ENCOUNTER — Other Ambulatory Visit: Payer: Self-pay

## 2019-09-27 MED ORDER — NITROGLYCERIN 0.2 MG/HR TD PT24: MEDICATED_PATCH | TRANSDERMAL | 0 refills | Status: DC

## 2019-09-28 ENCOUNTER — Other Ambulatory Visit: Payer: Self-pay

## 2019-09-28 ENCOUNTER — Ambulatory Visit: Payer: 59 | Admitting: Sports Medicine

## 2019-09-28 DIAGNOSIS — M7530 Calcific tendinitis of unspecified shoulder: Secondary | ICD-10-CM

## 2019-09-28 DIAGNOSIS — M7732 Calcaneal spur, left foot: Secondary | ICD-10-CM | POA: Diagnosis not present

## 2019-09-28 NOTE — Assessment & Plan Note (Addendum)
With associated Achilles tendinopathy.  Discussed that she would likely have minimal to no benefit from proceeding with formal physical therapy, however could potentially see improvement with continued conservative therapy vs proceeding with surgical intervention.  She opted for conservative therapy for another 2-3 months prior to considering surgery.  Discussed transitioning out of the boot into a comfortable shoe with elevated heel and one that does not press onto her posterior heel.  Recommended continuing non painful exercises and Pilates as tolerated.  Will have her follow-up in approximately 2-3 months to assess status.  NTG seems to help her pain level and will have her continue this.

## 2019-09-28 NOTE — Assessment & Plan Note (Signed)
Significant improvement with injections.  Can consider subacromial injections PRN in the future if needed.

## 2019-09-28 NOTE — Progress Notes (Signed)
[name] [name] - 58 y.o. female MRN [id number]  Date of birth: 1960-11-12  SUBJECTIVE:   CC: Follow-up left Achilles   HPI: [name] [name] is a 58 year old female presenting for follow-up of her left Achilles and right shoulder pain.  Left Achilles pain: Minimally better.  Last seen on 10/1, previously noted to have a posterior lateral calcaneus bone spur causing Achilles tendinopathy.  She has also been following up with [facility] foot and ankle specialist team - Dr. Tawni [name].  She has been doing eccentric exercises, Pilates, and using nitroglycerin patches.  Additionally, she has now been in a boot with a heel pad for 11 weeks.  She has no pain while she is wearing the boot, however intermittently she trials out of the boot and will have recurrent pain.  Burning sensation mainly.  She is really hoping to get back into walking, but cannot do that with this.  Cannot sleep with pressure on her heel.  Saw her foot and ankle specialist yesterday who recommended either proceeding with surgical intervention or trialing physical therapy for 2 months and reassessing.  She would like a second opinion on this, would like to avoid surgery if she can.  Right shoulder pain: Known calcific supraspinatus tendinopathy.  Had a subacromial injection performed on 10/1.  States the injection helped her significantly, is not having too much trouble with her shoulder at all now.   ROS: No unexpected weight loss, fever, chills, swelling, instability, muscle pain, numbness/tingling, redness, otherwise see HPI   PMHx -  reviewed.  PSHx -reviewed.  FHx - reviewed.   Social Hx - reviewed.  Medications - reviewed   PHYSICAL EXAM:  VS: BP:122/84  HR: bpm  TEMP: ( )  RESP:   HT:5' 4.25" (163.2 cm)   WT:175 lb (79.4 kg)  BMI:29.8 PHYSICAL EXAM: Gen: NAD, alert, cooperative with exam, well-appearing CV:  no edema, capillary refill brisk, normal rate Resp: non-labored Skin: no rashes, normal turgor  Neuro: no gross  deficits.  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 with 5/5 muscle strength, however endorses tenderness with dorsiflexion  Right foot/ankle: No deformity or swelling noted Nontender to palpation Full ROM 5 of 5 strength Neurovascularly intact  Right shoulder: Nontender to palpation throughout entire shoulder joint with no deformities noted F ROM with 5/5 muscle strength Negative Hawkins, Neer's, and empty can test, mild pain with O'Brien's  XR of Left Ankle: large calcific spur at AT posterior insertion and also a superior calcification where AT attaches to calcaneus  ASSESSMENT & PLAN:   Bone spur of posterior portion of left calcaneus With associated Achilles tendinopathy.  Discussed that she would likely have minimal to no benefit from proceeding with formal physical therapy, however could potentially see improvement with continued conservative therapy vs proceeding with surgical intervention.  She opted for conservative therapy for another 2-3 months prior to considering surgery.  Discussed transitioning out of the boot into a comfortable shoe that does not press onto her heel and has appropriate heel padding.  Recommended continuing eccentric exercises and Pilates as tolerated.  Will have her follow-up in approximately 2-3 months to assess status.   Calcific supraspinatus tendinitis Significant improvement with injections.  Can consider subacromial injections PRN in the future if needed.   Follow-up in approximately 2-3 months for above  Patriciaann Clan, DO  Family Medicine PGY-2  I observed and examined the patient with the resident and agree with assessment and plan.  Note reviewed and modified by me. KB fields, MD

## 2019-09-28 NOTE — Patient Instructions (Addendum)
It was so wonderful to see you today.  I believe it is reasonable to continue towards a conservative therapy including getting a comfortable shoe with a 1 inch heel pad for the next 2-3 months and see how you do versus also reasonable to proceed with surgery--this is up to you.

## 2019-10-25 ENCOUNTER — Other Ambulatory Visit: Payer: Self-pay | Admitting: Sports Medicine

## 2019-11-22 ENCOUNTER — Other Ambulatory Visit: Payer: Self-pay | Admitting: Sports Medicine

## 2019-12-14 ENCOUNTER — Ambulatory Visit: Payer: 59 | Admitting: Sports Medicine

## 2019-12-14 ENCOUNTER — Other Ambulatory Visit: Payer: Self-pay

## 2019-12-14 ENCOUNTER — Encounter: Payer: Self-pay | Admitting: Sports Medicine

## 2019-12-14 VITALS — BP 132/84 | Ht 64.0 in | Wt 176.0 lb

## 2019-12-14 DIAGNOSIS — M7530 Calcific tendinitis of unspecified shoulder: Secondary | ICD-10-CM | POA: Diagnosis not present

## 2019-12-14 MED ORDER — PREDNISONE 20 MG PO TABS: 20.0000 mg | ORAL_TABLET | Freq: Two times a day (BID) | ORAL | 0 refills | Status: AC

## 2019-12-14 NOTE — Progress Notes (Signed)
   [facility] [address] [place], [place] [address] Phone: [phone] Fax: [phone]   Patient Name: [name] [name] Date of Birth: 06/17/1961 Medical Record Number: [id number] Gender: female Date of Encounter: 12/14/2019  SUBJECTIVE:      Chief Complaint:  Follow-up right shoulder pain   HPI:  [name] [name] is following up for chronic right shoulder calcific tendinitis. This weekend she woke up in the middle of the night with increased pain and decreased range of motion. She does not recall a specific event, but did visit her daughter out of town and slept on a different mattress.  Aggravating symptoms include putting on bra or seatbelt.  She is still able to do Pilates, but feels yesterday session was hindered by pain.  She is using Tylenol arthritis amitriptyline nightly.  SHe has had 3 injections into her shoulder, each time she feels the efficacy of the injection lasts less and less.  Leff heel in a boot for Haglund as evluated by Foot [facility] with [facility].  Now 5 months.  Less pain but not wanting surgery.     ROS:     See HPI.   PERTINENT  PMH / PSH / FH / SH:  Past Medical, Surgical, Social, and Family History Reviewed & Updated in the EMR. Pertinent findings include:  Right hip OA, bone spur of left calcaneus   OBJECTIVE:  BP 132/84   Ht 5\' 4"  (1.626 m)   Wt 176 lb (79.8 kg)   BMI 30.21 kg/m  Physical Exam:  Vital signs are reviewed.   GEN: Alert and oriented, NAD Pulm: Breathing unlabored PSY: normal mood, congruent affect  MSK: Right shoulder Well developed, well nourished, in no acute distress. No swelling, ecchymoses.  No gross deformity. Anterior shoulder TTP. Decreased ROM in internal rotation. Strength 4+/5 with empty can and resisted internal/external rotation. Negative Hawkins, Neers. Negative Yergasons. Negative apprehension. NV intact distally.  Limited MSK Ultrasound: Right shoulder No evidence of joint effusion.    The biceps brachii long head tendon is normal without tendinosis, tear, tenosynovitis, or subluxation/dislocation in short and long axis view. Infraspinatus, subscapularis, and teres minor tendons visualized without abnormality Calcification visualized and supraspinatus with possible smaller calcification that was not seen on previous scan No subacromial bursal abnormality  Impression: Right shoulder calcification with new smaller calcification   ASSESSMENT & PLAN:   1. Right shoulder pain  Given that patient strength is still intact and no signs of tearing on ultrasound, she likely broke off a piece of the chronic calcification and now has a smaller calcific spur that is causing her symptoms.  We will trial a 7-day course of prednisone 20 mg twice daily for 7 days.  If she feels this is not helping, she will call us back in 1-2 weeks at which time we can consider a steroid injection into her shoulder.  2. Haglunds - I advised she should try to progress this out of boot and find shoes that do not put pressure on spur.  Quick POCUS scan showed AT intact and spurring did not have excessive swelling surrounding the area.   Lanier Clam, DO, ATC Sports Medicine Fellow  I observed and examined the patient with Dr. Kathrynn Speed and agree with assessment and plan.  Note reviewed and modified by me.  I spent 34 minutes with this patient. Over 50% of visit was spend in counseling and coordination of care for problems with calcific tendinopathy. Ila Mcgill, MD

## 2019-12-20 ENCOUNTER — Other Ambulatory Visit: Payer: Self-pay | Admitting: Sports Medicine

## 2020-01-04 ENCOUNTER — Other Ambulatory Visit: Payer: Self-pay | Admitting: Sports Medicine

## 2020-01-11 ENCOUNTER — Ambulatory Visit: Payer: 59 | Admitting: Sports Medicine

## 2020-03-15 ENCOUNTER — Other Ambulatory Visit: Payer: Self-pay | Admitting: Sports Medicine

## 2020-03-28 ENCOUNTER — Other Ambulatory Visit: Payer: Self-pay

## 2020-07-11 ENCOUNTER — Other Ambulatory Visit: Payer: Self-pay | Admitting: Sports Medicine

## 2021-01-04 ENCOUNTER — Ambulatory Visit: Payer: 59 | Admitting: Sports Medicine

## 2021-01-25 ENCOUNTER — Other Ambulatory Visit: Payer: Self-pay

## 2021-01-25 ENCOUNTER — Ambulatory Visit: Payer: 59 | Admitting: Sports Medicine

## 2021-01-25 VITALS — BP 124/68 | Ht 64.0 in | Wt 141.0 lb

## 2021-01-25 DIAGNOSIS — M25552 Pain in left hip: Secondary | ICD-10-CM

## 2021-01-25 DIAGNOSIS — Z96643 Presence of artificial hip joint, bilateral: Secondary | ICD-10-CM

## 2021-01-25 DIAGNOSIS — G8929 Other chronic pain: Secondary | ICD-10-CM

## 2021-01-25 DIAGNOSIS — Y93B4 Activity, pilates: Secondary | ICD-10-CM | POA: Diagnosis not present

## 2021-01-25 NOTE — Progress Notes (Addendum)
Office Visit Note   Patient: [name] [name]           Date of Birth: February 21, 1961           MRN: [id number] Visit Date: 01/25/2021 Requested by: Aura Dials, PA-C [place],  [place] [address] PCP: Aura Dials, PA-C  Subjective: CC: Left hip pain  HPI: 60 year old female who is presenting to clinic today with concerns of at least 10 years of left anterior hip pain.  Patient states that this pain is significantly troublesome during her Pilates and yoga routines, and she feels as though it is her hip flexor.  She has tried hip flexor stretches and exercises on her own with very minimal improvement.  She states that when she does hip flexor stretches and yoga "they do not ever go deep enough."  She has previously had dry needling for her shoulders, which had marked improvement to her pain, and she is curious about doing this for her hip flexors.  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  Later, she had a hip replacement on the right, and has been completely pain-free on this side since. Her pain is located within the left groin and down the anterior aspect of her thigh.  She states there are some days it is so bad she has to "lift my leg to get into the car."  She is worried that it may be due to a failed hip replacement, and states that she wants a rehabilitation plan that will allow her to exercise in a meaningful way.  She is trying very hard to be very active, with several Pilates and yoga classes weekly.  She says her hip pain has been complicated by Achilles tendinopathy and Haglund deformity requiring surgical repair, which left her in a boot for "almost a year."  She feels like this may have worsened her symptoms and delayed her recovery.  Fortunately, her Achilles is now fully recovered, which means she has been more active with walking and the aforementioned exercise classes. She also states that  her pain is now involving the lateral aspect of her left hip, which will wake her up at night when she rolls onto the side.  Given her history of right shoulder pain, she is primarily a left-sided sleeper and this is causing her quite a bit of distress.  She has no additional concerns today.              ROS:   All other systems were reviewed and are negative.  Objective: Vital Signs: BP 124/68   Ht 5\' 4"  (1.626 m)   Wt 141 lb (64 kg)   BMI 24.20 kg/m   Physical Exam:  General:  Alert and oriented, in no acute distress. Pulm:  Breathing unlabored. Psy:  Normal mood, congruent affect. Skin: No bruises, no rashes. Left hip:  Normal gait.  No varus or valgus deformity of the knee, appropriate Q angle of the hip.  No pelvic asymmetry.   Full range of motion of hip with flexion and extension.  Symmetric internal and external rotation, though she does endorse pain with internal rotation.  Strength: 5 out of 5 strength with hip flexion (quite painful), abduction and adduction as well as knee flexion and extension, and ankle dorsi/plantarflexion.  Palpation: Tenderness to palpation of anterior hip flexors.  Also endorses tenderness over the greater trochanteric area.  No tenderness over  the piriformis, or gluteal musculature.    Supine exam: Endorses some discomfort with external rotation during logroll.  FADIR does cause some deep groin pain. FABER produces no posterior or groin pain.   Trendelenburg's with reduced pelvic stabilizer strength on the left.  This is further demonstrated by positive to finger test with resisted leg rise on the left.  Imaging: No results found.  Assessment & Plan: 60 year old female presenting to clinic with chronic anterior left leg pain, now with lateral involvement.  Examination concerning for possible hip flexor tendinopathy, though cannot exclude pain from the hip replacement itself.   -Will order x-rays today to help evaluate the integrity of the hip  replacement. -Given significant gluteal weakness on examination as well as anterior hip flexor tender points, suspect patient may benefit significantly from dedicated physical therapy to this area.  Will place referral to meet with physical therapist Raeford Razor, who also has great experience with Pilates athletes. -Return to clinic in 4 to 6 weeks for reevaluation. -Patient is agreeable with plan.  She had no further questions or concerns today.   Patient seen and evaluated with the sports medicine fellow.  I agree with the above plan of care.  We we will order x-rays of the left hip including an AP pelvis and refer the patient to [facility] for physical therapy.  She may wean to home exercise program per her discretion.  Phone follow-up with x-ray results when available.  Follow-up in 4 to 6 weeks.     Addendum (02/01/2021): X-rays reviewed.  Left hip prosthesis is intact without evidence of loosening.  Patient will be notified via telephone of these results.

## 2021-01-26 ENCOUNTER — Ambulatory Visit
Admission: RE | Admit: 2021-01-26 | Discharge: 2021-01-26 | Disposition: A | Discharge status: Home / Self Care | Payer: 59 | Source: Ambulatory Visit | Attending: Sports Medicine | Admitting: Sports Medicine

## 2021-01-26 DIAGNOSIS — G8929 Other chronic pain: Secondary | ICD-10-CM

## 2021-02-15 ENCOUNTER — Ambulatory Visit: Payer: 59 | Admitting: Physical Therapy

## 2021-02-19 ENCOUNTER — Other Ambulatory Visit: Payer: Self-pay | Admitting: Sports Medicine

## 2021-03-05 ENCOUNTER — Other Ambulatory Visit: Payer: Self-pay

## 2021-03-05 ENCOUNTER — Ambulatory Visit: Payer: 59 | Attending: Physician Assistant | Admitting: Physical Therapy

## 2021-03-05 ENCOUNTER — Encounter: Payer: Self-pay | Admitting: Physical Therapy

## 2021-03-05 DIAGNOSIS — M6281 Muscle weakness (generalized): Secondary | ICD-10-CM

## 2021-03-05 DIAGNOSIS — M24552 Contracture, left hip: Secondary | ICD-10-CM | POA: Diagnosis present

## 2021-03-05 DIAGNOSIS — M25552 Pain in left hip: Secondary | ICD-10-CM | POA: Diagnosis present

## 2021-03-06 NOTE — Therapy (Signed)
[place], [place], [address] Phone: [phone]   Fax:  [phone]  Physical Therapy Evaluation  Patient Details  Name: Chaney Ingram MRN: [id number] Date of Birth: 04-22-1961 Referring Provider (PT): Dr. Lilia Argue   Encounter Date: 03/05/2021   PT End of Session - 03/05/21 1932    Visit Number 1    Number of Visits 8    Date for PT Re-Evaluation 04/30/21    Authorization Type Aetna    PT Start Time 1000    PT Stop Time 1053    PT Time Calculation (min) 53 min    Activity Tolerance Patient tolerated treatment well    Behavior During Therapy [facility] for tasks assessed/performed           Past Medical History:  Diagnosis Date  . Anemia    FALL 2015 - IRON INFUSIONS X2 NOV 2015 - HX OF HEAVY MENSES / UTERINE FIBROIDS  . Arthritis    osteoarthritis- hips/back  . Complication of anesthesia    SPINAL FOR HIP REPLACMENT - BLOOD PRESSURE DROPPED SEVERELY AND KEPT IN RECOVERY ROOM LONGER THAN NORMAL  . Gallstones    MID CHEST PAIN, SOME BACK PAIN AND RIGHT UPPER ABD PAIN  . GERD (gastroesophageal reflux disease)   . H/O seasonal allergies   . Hiatal hernia 04/01/2005   Dr. Sharlett Iles   . Sinus drainage 10/18/13    Past Surgical History:  Procedure Laterality Date  . CESAREAN SECTION    . CHOLECYSTECTOMY N/A 11/16/2014   Procedure: LAPAROSCOPIC CHOLECYSTECTOMY;  Surgeon: Rolm Bookbinder, MD;  Location: [facility];  Service: General;  Laterality: N/A;  . COLONOSCOPY W/ POLYPECTOMY    . ESOPHAGOGASTRODUODENOSCOPY  04/01/2005   HH and GERD   . TOTAL HIP ARTHROPLASTY Left 10/19/2013   Procedure: LEFT TOTAL HIP ARTHROPLASTY ANTERIOR APPROACH; LEFT;  Surgeon: Mauri Pole, MD;  Location: [facility];  Service: Orthopedics;  Laterality: Left;    There were no vitals filed for this visit.    Subjective Assessment - 03/05/21 1009    Subjective Patient with chronic hip pain located in her L groin which has  become fairly constant , yet mild overall.  She does Pilates (group and private) and it interferes with certain exercise. She used to be able to modify but things are not working as well.   She has developed lateral L hip pain with walking > 2.5 miles.  She is still able to lie on her L side but only if she was not walked too much.  She has intermittent L 1st and 2 nd toe numbness.  Less trusting of her LLE but she denies weakness.    Limitations Lifting;Walking    How long can you sit comfortably? not limited    How long can you stand comfortably? not limted    How long can you walk comfortably? 2.5 miles    Diagnostic tests Xr hip was clear.    Patient Stated Goals I would like to be to a point where I can either resolve the pain or just get a MRI.    Currently in Pain? Yes    Pain Score 1     Pain Location Hip    Pain Orientation Left;Anterior;Lateral    Pain Descriptors / Indicators Aching;Sore    Pain Type Chronic pain    Pain Onset More than a month ago   outer hip 2020/2021   Pain Frequency Constant    Aggravating Factors  certain  exercises involving incr hip flexor    Pain Relieving Factors ice, tylenol /ibuproen at time              [facility] PT Assessment - 03/05/21 0001      Assessment   Medical Diagnosis L hip pain, chronic    Referring Provider (PT) Dr. Lilia Argue    Onset Date/Surgical Date --   2020   Next MD Visit after PT    Prior Therapy Yes for hips      Precautions   Precautions None      Restrictions   Weight Bearing Restrictions No      Balance Screen   Has the patient fallen in the past 6 months No      [place] residence    Living Arrangements Alone      Prior Function   Level of Independence Independent      Cognition   Overall Cognitive Status Within Functional Limits for tasks assessed      Observation/Other Assessments   Observations LLE slightly longer than Rt    Focus on Therapeutic Outcomes (FOTO)   emailed      Sensation   Light Touch Impaired by gross assessment    Additional Comments toes      Functional Tests   Functional tests Squat;Single leg stance      Squat   Comments groin pain if a deeper squat, decent form through hips and knees      Single Leg Stance   Comments WFL      Posture/Postural Control   Posture/Postural Control Postural limitations    Postural Limitations Rounded Shoulders;Forward head;Decreased lumbar lordosis      AROM   Overall AROM Comments trunk motions WFL no pain      PROM   Overall PROM Comments pain with L hip flexion , IR      Strength   Right Hip Flexion 4-/5    Right Hip Extension 4+/5    Right Hip ABduction 5/5    Right Hip ADduction 4/5    Left Hip Flexion 3+/5   pain after   Left Hip Extension 4+/5    Left Hip ABduction 4/5    Left Hip ADduction 4/5    Right/Left Knee --   5/5     Palpation   Palpation comment sore through L piriformis , anterior L hip with palpation, including rectus femoris, pectineus and iliopsoas (min)      Transfers   Five time sit to stand comments  9 sec            Objective measurements completed on examination: See above findings.       PT Education - 03/05/21 1931    Education Details PT.POC, anatomy of hip, strain on hip flexor with abs    Person(s) Educated Patient    Methods Explanation    Comprehension Verbalized understanding;Returned demonstration            PT Short Term Goals - 03/05/21 1934      PT SHORT TERM GOAL #1   Title Pt will be I with HEP for hips, lower abs    Time 4    Period Weeks    Status New    Target Date 04/02/21      PT SHORT TERM GOAL #2   Title Pt will be able to report less lateral hip pain with walking 2.5 miles, < 2/10    Time 4  Period Weeks    Status New    Target Date 04/02/21      PT SHORT TERM GOAL #3   Title Pt will be able to understand FOTO score and potential to improve    Time 4    Period Weeks    Status New    Target Date  04/02/21             PT Long Term Goals - 03/05/21 1935      PT LONG TERM GOAL #1   Title Pt will be able to lift L leg into the car without the needs for UE and no pain    Time 8    Period Weeks    Status New    Target Date 04/30/21      PT LONG TERM GOAL #2   Title pt will be able to perform single leg stretch (Pilates ab series) and squat without pain in L hip flexor    Time 8    Period Weeks    Status New    Target Date 04/30/21      PT LONG TERM GOAL #3   Title Pt will be I with HEP    Time 8    Period Weeks    Status New    Target Date 04/30/21      PT LONG TERM GOAL #4   Title FOTO score TBD    Time 8    Period Weeks    Status New    Target Date 04/30/21      PT LONG TERM GOAL #5   Title Pt will be able to walk 3 miles without increasing L hip pain    Time 8    Period Weeks    Status New    Target Date 04/30/21                  Plan - 03/05/21 1930    Clinical Impression Statement Pt presents for mod complexity eval of L sided hip pain which is chronic and evolving to be more posterolateral in presentation.  She has had groin pain for > 1 yr but now it wraps laterally to L glute especially with walking longer distances.  She has min weakness in hip flexors , adductors and hip abduction.  Decreased ROM in L hip compared to Rt.  Both hips have been replaced.  She has a good home routine with PIlates and yoga but would benefit from skilled PT to address these deficits integrating Pilates into her plan.    Personal Factors and Comorbidities Time since onset of injury/illness/exacerbation;Comorbidity 2    Comorbidities THA bilateral 2014 and 2017 , achillles repair    Examination-Activity Limitations Transfers;Locomotion Level    Examination-Participation Restrictions Community Activity;Interpersonal Relationship    Stability/Clinical Decision Making Evolving/Moderate complexity    Clinical Decision Making Moderate    Rehab Potential Excellent    PT  Frequency 1x / week    PT Duration 8 weeks    PT Treatment/Interventions ADLs/Self Care Home Management;Cryotherapy;Iontophoresis 4mg /ml Dexamethasone;Moist Heat;Ultrasound;Electrical Stimulation;Therapeutic activities;Therapeutic exercise;Balance training;Functional mobility training;Taping;Patient/family education;Manual techniques;Passive range of motion;Dry needling    PT Next Visit Plan thomas test position, try foam roller, quadruped for hips , low abs, core HEP    PT Home Exercise Plan none on eval , has a routine from previous    Consulted and Agree with Plan of Care Patient           Patient will benefit from skilled  therapeutic intervention in order to improve the following deficits and impairments:  Decreased mobility,Pain,Impaired flexibility,Increased fascial restricitons,Decreased strength,Decreased range of motion  Visit Diagnosis: Pain in left hip  Left hip flexor tightness  Muscle weakness (generalized)     Problem List Patient Active Problem List   Diagnosis Date Noted  . Bone spur of posterior portion of left calcaneus 08/12/2019  . Calcific supraspinatus tendinitis 02/27/2017  . Osteoarthritis of right hip 09/03/2016  . Abdominal pain, epigastric 08/11/2014  . Microcytic anemia 08/11/2014  . Expected blood loss anemia 10/20/2013  . Overweight (BMI 25.0-29.9) 10/20/2013  . 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 ABDOMINAL PAIN, GENERALIZED 03/22/2008  . URI 11/26/2007  . CONTUSION, RIGHT FOOT 07/14/2007  . RHINITIS, ALLERGIC 01/08/2007    Karessa Onorato 03/06/2021, 7:41 AM  [facility] [address] [place], [place], [address] Phone: [phone]   Fax:  [phone]  Name: Rosamaria Donn MRN: [id number] Date of Birth: October 09, 1961   Raeford Razor, PT 03/06/21 7:41 AM Phone: [phone] Fax: [phone]

## 2021-03-08 ENCOUNTER — Ambulatory Visit: Payer: 59 | Admitting: Physical Therapy

## 2021-03-12 ENCOUNTER — Ambulatory Visit: Payer: 59 | Attending: Physician Assistant | Admitting: Physical Therapy

## 2021-03-12 ENCOUNTER — Other Ambulatory Visit: Payer: Self-pay

## 2021-03-12 ENCOUNTER — Encounter: Payer: Self-pay | Admitting: Physical Therapy

## 2021-03-12 DIAGNOSIS — M25552 Pain in left hip: Secondary | ICD-10-CM | POA: Insufficient documentation

## 2021-03-12 DIAGNOSIS — M24552 Contracture, left hip: Secondary | ICD-10-CM | POA: Insufficient documentation

## 2021-03-12 DIAGNOSIS — M6281 Muscle weakness (generalized): Secondary | ICD-10-CM | POA: Diagnosis present

## 2021-03-12 NOTE — Patient Instructions (Signed)
Access Code: [email or web address] URL: [email or web address] Date: 03/12/2021 Prepared by: Raeford Razor  Exercises [place] [employer] on Table - 2 x daily - 7 x weekly - 1 sets - 5 reps - 30 hold Pilates Bridge - 1 x daily - 7 x weekly - 2 sets - 10 reps - 5 hold Bridge with Hip Abduction and Resistance - 1 x daily - 7 x weekly - 2 sets - 10-15 reps Clamshell with Resistance - 1 x daily - 7 x weekly - 2 sets - 10-15 reps - 3 hold

## 2021-03-12 NOTE — Therapy (Signed)
[place], [place], [address] Phone: [phone]   Fax:  [phone]  Physical Therapy Treatment  Patient Details  Name: [name] [name] MRN: [id number] Date of Birth: Nov 17, 1960 Referring Provider (PT): Dr. Lilia Argue   Encounter Date: 03/12/2021   PT End of Session - 03/12/21 0935    Visit Number 2    Number of Visits 8    Date for PT Re-Evaluation 04/30/21    Authorization Type Aetna    PT Start Time 0920    PT Stop Time 1002    PT Time Calculation (min) 42 min    Activity Tolerance Patient tolerated treatment well    Behavior During Therapy [facility] for tasks assessed/performed           Past Medical History:  Diagnosis Date  . Anemia    FALL 2015 - IRON INFUSIONS X2 NOV 2015 - HX OF HEAVY MENSES / UTERINE FIBROIDS  . Arthritis    osteoarthritis- hips/back  . Complication of anesthesia    SPINAL FOR HIP REPLACMENT - BLOOD PRESSURE DROPPED SEVERELY AND KEPT IN RECOVERY ROOM LONGER THAN NORMAL  . Gallstones    MID CHEST PAIN, SOME BACK PAIN AND RIGHT UPPER ABD PAIN  . GERD (gastroesophageal reflux disease)   . H/O seasonal allergies   . Hiatal hernia 04/01/2005   Dr. Sharlett Iles   . Sinus drainage 10/18/13    Past Surgical History:  Procedure Laterality Date  . CESAREAN SECTION    . CHOLECYSTECTOMY N/A 11/16/2014   Procedure: LAPAROSCOPIC CHOLECYSTECTOMY;  Surgeon: Rolm Bookbinder, MD;  Location: [facility];  Service: General;  Laterality: N/A;  . COLONOSCOPY W/ POLYPECTOMY    . ESOPHAGOGASTRODUODENOSCOPY  04/01/2005   HH and GERD   . TOTAL HIP ARTHROPLASTY Left 10/19/2013   Procedure: LEFT TOTAL HIP ARTHROPLASTY ANTERIOR APPROACH; LEFT;  Surgeon: Mauri Pole, MD;  [facility];  Service: Orthopedics;  Laterality: Left;    There were no vitals filed for this visit.   Subjective Assessment - 03/12/21 0924    Subjective Patient had some gripping in her L anterior hip this weekend  especially with 3 way hip stretch. pain sleeping on L side. Doing yoga and pilates today.  Did walk 2.5 miles this Sat.    Currently in Pain? No/denies             [facility] Adult PT Treatment/Exercise - 03/12/21 0001      Self-Care   Self-Care Other Self-Care Comments    Other Self-Care Comments  see education      Knee/Hip Exercises: Stretches   Hip Flexor Stretch Limitations 3 ways for 30 sec      Knee/Hip Exercises: Standing   Hip Flexion Limitations hip flexor stretch , 2 ways      Knee/Hip Exercises: Supine   Bridges 2 sets    Bridges Limitations x10 used    Bridges with Clamshell 1 set   10 reps   Other Supine Knee/Hip Exercises lower abs: modified with knees bent and hans under tailbone, chest curl: scissors, 90/90ab hold,, roll down/up    Other Supine Knee/Hip Exercises bent knee fall out blue band x 10 alt.      Knee/Hip Exercises: Sidelying   Hip ABduction Strengthening;Both;2 sets;15 reps    Hip ABduction Limitations with and without blue band , limited ROM   CLAM not hip abd   Clams see above  PT Education - 03/12/21 1210    Education Details HEP, modifcations for pilates/ab exercises    Person(s) Educated Patient    Methods Explanation;Demonstration;Handout;Verbal cues    Comprehension Verbalized understanding;Returned demonstration            PT Short Term Goals - 03/05/21 1934      PT SHORT TERM GOAL #1   Title Pt will be I with HEP for hips, lower abs    Time 4    Period Weeks    Status New    Target Date 04/02/21      PT SHORT TERM GOAL #2   Title Pt will be able to report less lateral hip pain with walking 2.5 miles, < 2/10    Time 4    Period Weeks    Status New    Target Date 04/02/21      PT SHORT TERM GOAL #3   Title Pt will be able to understand FOTO score and potential to improve    Time 4    Period Weeks    Status New    Target Date 04/02/21             PT Long Term Goals - 03/05/21 1935      PT  LONG TERM GOAL #1   Title Pt will be able to lift L leg into the car without the needs for UE and no pain    Time 8    Period Weeks    Status New    Target Date 04/30/21      PT LONG TERM GOAL #2   Title pt will be able to perform single leg stretch (Pilates ab series) and squat without pain in L hip flexor    Time 8    Period Weeks    Status New    Target Date 04/30/21      PT LONG TERM GOAL #3   Title Pt will be I with HEP    Time 8    Period Weeks    Status New    Target Date 04/30/21      PT LONG TERM GOAL #4   Title FOTO score TBD    Time 8    Period Weeks    Status New    Target Date 04/30/21      PT LONG TERM GOAL #5   Title Pt will be able to walk 3 miles without increasing L hip pain    Time 8    Period Weeks    Status New    Target Date 04/30/21                 Plan - 03/12/21 1211    Clinical Impression Statement Pt with baseline level of groin pain, minimal.  Worked on HEP for lengthening anterior hip and activation of glutes without increased pain post session.  Cont to work on lower ab strength and stability with cont PT.    PT Treatment/Interventions ADLs/Self Care Home Management;Cryotherapy;Iontophoresis 4mg /ml Dexamethasone;Moist Heat;Ultrasound;Electrical Stimulation;Therapeutic activities;Therapeutic exercise;Balance training;Functional mobility training;Taping;Patient/family education;Manual techniques;Passive range of motion;Dry needling    PT Next Visit Plan HEPcheck  quadruped for hips , low abs, core HEP    PT Home Exercise Plan [email or web address]    Consulted and Agree with Plan of Care Patient           Patient will benefit from skilled therapeutic intervention in order to improve the following deficits and impairments:  Decreased mobility,Pain,Impaired flexibility,Increased fascial restricitons,Decreased strength,Decreased  range of motion  Visit Diagnosis: Pain in left hip  Left hip flexor tightness  Muscle weakness  (generalized)     Problem List Patient Active Problem List   Diagnosis Date Noted  . Bone spur of posterior portion of left calcaneus 08/12/2019  . Calcific supraspinatus tendinitis 02/27/2017  . Osteoarthritis of right hip 09/03/2016  . Abdominal pain, epigastric 08/11/2014  . Microcytic anemia 08/11/2014  . Expected blood loss anemia 10/20/2013  . Overweight (BMI 25.0-29.9) 10/20/2013  . 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 ABDOMINAL PAIN, GENERALIZED 03/22/2008  . URI 11/26/2007  . CONTUSION, RIGHT FOOT 07/14/2007  . RHINITIS, ALLERGIC 01/08/2007    Deno Sida 03/12/2021, 12:15 PM  [facility] [address] [place], [place], [address] Phone: [phone]   Fax:  [phone]  Name: Tyreona Panjwani MRN: [id number] Date of Birth: January 01, 1961  Raeford Razor, PT 03/12/21 12:16 PM Phone: [phone] Fax: [phone]

## 2021-03-23 ENCOUNTER — Encounter: Payer: Self-pay | Admitting: Physical Therapy

## 2021-03-23 ENCOUNTER — Ambulatory Visit: Payer: 59 | Admitting: Physical Therapy

## 2021-03-23 ENCOUNTER — Other Ambulatory Visit: Payer: Self-pay

## 2021-03-23 DIAGNOSIS — M25552 Pain in left hip: Secondary | ICD-10-CM | POA: Diagnosis not present

## 2021-03-23 DIAGNOSIS — M6281 Muscle weakness (generalized): Secondary | ICD-10-CM

## 2021-03-23 DIAGNOSIS — M24552 Contracture, left hip: Secondary | ICD-10-CM

## 2021-03-23 NOTE — Therapy (Addendum)
[place], [place], [address] Phone: [phone]   Fax:  [phone]  Physical Therapy Treatment  Patient Details  Name: [name] [name] MRN: [id number] Date of Birth: 18-Jan-1961 Referring Provider (PT): Dr. Lilia Argue   Encounter Date: 03/23/2021   PT End of Session - 03/23/21 1057    Visit Number 3    Number of Visits 8    Date for PT Re-Evaluation 04/30/21    Authorization Type Aetna    PT Start Time 0102    PT Stop Time 1133    PT Time Calculation (min) 42 min    Activity Tolerance Patient tolerated treatment well    Behavior During Therapy [facility] for tasks assessed/performed           Past Medical History:  Diagnosis Date  . Anemia    FALL 2015 - IRON INFUSIONS X2 NOV 2015 - HX OF HEAVY MENSES / UTERINE FIBROIDS  . Arthritis    osteoarthritis- hips/back  . Complication of anesthesia    SPINAL FOR HIP REPLACMENT - BLOOD PRESSURE DROPPED SEVERELY AND KEPT IN RECOVERY ROOM LONGER THAN NORMAL  . Gallstones    MID CHEST PAIN, SOME BACK PAIN AND RIGHT UPPER ABD PAIN  . GERD (gastroesophageal reflux disease)   . H/O seasonal allergies   . Hiatal hernia 04/01/2005   Dr. Sharlett Iles   . Sinus drainage 10/18/13    Past Surgical History:  Procedure Laterality Date  . CESAREAN SECTION    . CHOLECYSTECTOMY N/A 11/16/2014   Procedure: LAPAROSCOPIC CHOLECYSTECTOMY;  Surgeon: Rolm Bookbinder, MD;  Location: [facility];  Service: General;  Laterality: N/A;  . COLONOSCOPY W/ POLYPECTOMY    . ESOPHAGOGASTRODUODENOSCOPY  04/01/2005   HH and GERD   . TOTAL HIP ARTHROPLASTY Left 10/19/2013   Procedure: LEFT TOTAL HIP ARTHROPLASTY ANTERIOR APPROACH; LEFT;  Surgeon: Mauri Pole, MD;  Location: [facility];  Service: Orthopedics;  Laterality: Left;    There were no vitals filed for this visit.   Subjective Assessment - 03/23/21 1055    Subjective Coming from her Pilates private, less sharp pain with teaser.  I  have done less of my HEP this week but more strengthening. No pain right now.             [facility] Adult PT Treatment/Exercise - 03/23/21 0001      Self-Care   Other Self-Care Comments  foam rolling to L quad, ITB,TFL and piriformis bilateral      Knee/Hip Exercises: Stretches   Hip Flexor Stretch Left;3 reps    Hip Flexor Stretch Limitations over foam roller      Moist Heat Therapy   Number Minutes Moist Heat 10 Minutes    Moist Heat Location Hip   anterior     Manual Therapy   Manual therapy comments supine and then in mod. thomast test position with knee flexed    Soft tissue mobilization L psoas, iliacus, L quads, ITB and TFL, IASTM    Muscle Energy Technique Resisted hip ext (hamstring x 5 )                  PT Education - 03/23/21 1553    Education Details Tr P DN, pelvic rotation , leg length discrepancy, foam rolling    Person(s) Educated Patient    Methods Explanation;Demonstration;Tactile cues    Comprehension Verbalized understanding;Returned demonstration;Verbal cues required            PT Short Term Goals -  03/23/21 1555      PT SHORT TERM GOAL #1   Title Pt will be I with HEP for hips, lower abs    Status On-going      PT SHORT TERM GOAL #2   Title Pt will be able to report less lateral hip pain with walking 2.5 miles, < 2/10    Status On-going      PT SHORT TERM GOAL #3   Title Pt will be able to understand FOTO score and potential to improve    Status On-going             PT Long Term Goals - 03/05/21 1935      PT LONG TERM GOAL #1   Title Pt will be able to lift L leg into the car without the needs for UE and no pain    Time 8    Period Weeks    Status New    Target Date 04/30/21      PT LONG TERM GOAL #2   Title pt will be able to perform single leg stretch (Pilates ab series) and squat without pain in L hip flexor    Time 8    Period Weeks    Status New    Target Date 04/30/21      PT LONG TERM GOAL #3   Title Pt will be  I with HEP    Time 8    Period Weeks    Status New    Target Date 04/30/21      PT LONG TERM GOAL #4   Title FOTO score TBD    Time 8    Period Weeks    Status New    Target Date 04/30/21      PT LONG TERM GOAL #5   Title Pt will be able to walk 3 miles without increasing L hip pain    Time 8    Period Weeks    Status New    Target Date 04/30/21                 Plan - 03/23/21 1109    Clinical Impression Statement Patient worked on recovery of L quad , hip mm today with a combination of foam rolling, manual therapy and heat.  She knows her HEP.  She is interested in dry needling in the coming weeks, will schedule. She has a mild leg length discrepancy that may post surgical.  L ant pelvis rotation may contribute to L ant hip shortening.    PT Treatment/Interventions ADLs/Self Care Home Management;Cryotherapy;Iontophoresis 4mg /ml Dexamethasone;Moist Heat;Ultrasound;Electrical Stimulation;Therapeutic activities;Therapeutic exercise;Balance training;Functional mobility training;Taping;Patient/family education;Manual techniques;Passive range of motion;Dry needling    PT Next Visit Plan HEP check  quadruped for hips , low abs, core HEP. did manual help?    PT Home Exercise Plan [email or web address]    Consulted and Agree with Plan of Care Patient           Patient will benefit from skilled therapeutic intervention in order to improve the following deficits and impairments:  Decreased mobility,Pain,Impaired flexibility,Increased fascial restricitons,Decreased strength,Decreased range of motion  Visit Diagnosis: Pain in left hip  Left hip flexor tightness  Muscle weakness (generalized)     Problem List Patient Active Problem List   Diagnosis Date Noted  . Bone spur of posterior portion of left calcaneus 08/12/2019  . Calcific supraspinatus tendinitis 02/27/2017  . Osteoarthritis of right hip 09/03/2016  . Abdominal pain, epigastric 08/11/2014  . Microcytic anemia 08/11/2014   .  Expected blood loss anemia 10/20/2013  . Overweight (BMI 25.0-29.9) 10/20/2013  . 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 ABDOMINAL PAIN, GENERALIZED 03/22/2008  . URI 11/26/2007  . CONTUSION, RIGHT FOOT 07/14/2007  . RHINITIS, ALLERGIC 01/08/2007    Keimani Laufer 03/23/2021, 4:26 PM  [facility] [address] [place], [place], [address] Phone: [phone]   Fax:  [phone]  Name: [name] [name] MRN: [id number] Date of Birth: 1960/11/29  Raeford Razor, PT 03/23/21 4:26 PM Phone: [phone] Fax: [phone]

## 2021-03-27 ENCOUNTER — Ambulatory Visit: Payer: 59 | Admitting: Physical Therapy

## 2021-03-27 ENCOUNTER — Encounter: Payer: Self-pay | Admitting: Physical Therapy

## 2021-03-27 ENCOUNTER — Other Ambulatory Visit: Payer: Self-pay

## 2021-03-27 DIAGNOSIS — M25552 Pain in left hip: Secondary | ICD-10-CM

## 2021-03-27 DIAGNOSIS — M6281 Muscle weakness (generalized): Secondary | ICD-10-CM

## 2021-03-27 DIAGNOSIS — M24552 Contracture, left hip: Secondary | ICD-10-CM

## 2021-03-27 NOTE — Therapy (Signed)
[place], [place], [address] Phone: [phone]   Fax:  [phone]  Physical Therapy Treatment  Patient Details  Name: [name] [name] MRN: [id number] Date of Birth: Nov 27, 1960 Referring Provider (PT): Dr. Lilia Argue   Encounter Date: 03/27/2021   PT End of Session - 03/27/21 1020    Visit Number 4    Number of Visits 8    Date for PT Re-Evaluation 04/30/21    Authorization Type Aetna    PT Start Time 1005    PT Stop Time 1045    PT Time Calculation (min) 40 min    Activity Tolerance Patient tolerated treatment well    Behavior During Therapy [facility] for tasks assessed/performed           Past Medical History:  Diagnosis Date  . Anemia    FALL 2015 - IRON INFUSIONS X2 NOV 2015 - HX OF HEAVY MENSES / UTERINE FIBROIDS  . Arthritis    osteoarthritis- hips/back  . Complication of anesthesia    SPINAL FOR HIP REPLACMENT - BLOOD PRESSURE DROPPED SEVERELY AND KEPT IN RECOVERY ROOM LONGER THAN NORMAL  . Gallstones    MID CHEST PAIN, SOME BACK PAIN AND RIGHT UPPER ABD PAIN  . GERD (gastroesophageal reflux disease)   . H/O seasonal allergies   . Hiatal hernia 04/01/2005   Dr. Sharlett Iles   . Sinus drainage 10/18/13    Past Surgical History:  Procedure Laterality Date  . CESAREAN SECTION    . CHOLECYSTECTOMY N/A 11/16/2014   Procedure: LAPAROSCOPIC CHOLECYSTECTOMY;  Surgeon: Rolm Bookbinder, MD;  Location: [facility];  Service: General;  Laterality: N/A;  . COLONOSCOPY W/ POLYPECTOMY    . ESOPHAGOGASTRODUODENOSCOPY  04/01/2005   HH and GERD   . TOTAL HIP ARTHROPLASTY Left 10/19/2013   Procedure: LEFT TOTAL HIP ARTHROPLASTY ANTERIOR APPROACH; LEFT;  Surgeon: Mauri Pole, MD;  Location: [facility];  Service: Orthopedics;  Laterality: Left;    There were no vitals filed for this visit.   Subjective Assessment - 03/27/21 1014    Subjective Pt has an awareness of hip discomfort no pain. I have been active  today but not stretching today.    Currently in Pain? Yes    Pain Score 1     Pain Location Hip    Pain Orientation Left;Anterior    Pain Descriptors / Indicators Discomfort    Pain Type Chronic pain    Pain Onset More than a month ago    Pain Frequency Constant    Aggravating Factors  certain exercise    Pain Relieving Factors rest, ice, OTC meds    Multiple Pain Sites No              Pilates Tower for LE/Core strength, postural strength, lumbopelvic disassociation and core control.  Exercises included:  Supine Leg Springs- yellow spring   Single leg arcs in parallel, ER and then circles x 10 each   Double leg arcs and frog squats x 15 each   Side lying clam green band x 15  And tight in abd/ER  No band x 15 and then diamond clam x 15   Sidelying Leg Springs   ITB and glute stretching x 30 sec each LE   Sidekick hip flexion and extension x 15       PT Short Term Goals - 03/23/21 1555      PT SHORT TERM GOAL #1   Title Pt will be I with HEP for hips,  lower abs    Status On-going      PT SHORT TERM GOAL #2   Title Pt will be able to report less lateral hip pain with walking 2.5 miles, < 2/10    Status On-going      PT SHORT TERM GOAL #3   Title Pt will be able to understand FOTO score and potential to improve    Status On-going             PT Long Term Goals - 03/05/21 1935      PT LONG TERM GOAL #1   Title Pt will be able to lift L leg into the car without the needs for UE and no pain    Time 8    Period Weeks    Status New    Target Date 04/30/21      PT LONG TERM GOAL #2   Title pt will be able to perform single leg stretch (Pilates ab series) and squat without pain in L hip flexor    Time 8    Period Weeks    Status New    Target Date 04/30/21      PT LONG TERM GOAL #3   Title Pt will be I with HEP    Time 8    Period Weeks    Status New    Target Date 04/30/21      PT LONG TERM GOAL #4   Title FOTO score TBD    Time 8    Period Weeks     Status New    Target Date 04/30/21      PT LONG TERM GOAL #5   Title Pt will be able to walk 3 miles without increasing L hip pain    Time 8    Period Weeks    Status New    Target Date 04/30/21                 Plan - 03/27/21 1657    Clinical Impression Statement Used Pilates tower exercises for hip ROM and strengthening with focus on alignment. No increase in pain .  Limited in bilateral hip external rotation and weakness evident wtih sidelying exercises. Cont POC to include dry needling.    PT Treatment/Interventions ADLs/Self Care Home Management;Cryotherapy;Iontophoresis 4mg /ml Dexamethasone;Moist Heat;Ultrasound;Electrical Stimulation;Therapeutic activities;Therapeutic exercise;Balance training;Functional mobility training;Taping;Patient/family education;Manual techniques;Passive range of motion;Dry needling    PT Next Visit Plan HEP check  quadruped for hips , low abs, core HEP. did manual help?    PT Home Exercise Plan [id number]    Consulted and Agree with Plan of Care Patient           Patient will benefit from skilled therapeutic intervention in order to improve the following deficits and impairments:  Decreased mobility,Pain,Impaired flexibility,Increased fascial restricitons,Decreased strength,Decreased range of motion  Visit Diagnosis: Pain in left hip  Left hip flexor tightness  Muscle weakness (generalized)     Problem List Patient Active Problem List   Diagnosis Date Noted  . Bone spur of posterior portion of left calcaneus 08/12/2019  . Calcific supraspinatus tendinitis 02/27/2017  . Osteoarthritis of right hip 09/03/2016  . Abdominal pain, epigastric 08/11/2014  . Microcytic anemia 08/11/2014  . Expected blood loss anemia 10/20/2013  . Overweight (BMI 25.0-29.9) 10/20/2013  . 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 ABDOMINAL PAIN, GENERALIZED 03/22/2008  . URI 11/26/2007  . CONTUSION, RIGHT FOOT 07/14/2007  . RHINITIS, ALLERGIC 01/08/2007     Rasean Joos 03/27/2021, 5:02 PM  [place], [place], [address] Phone: [phone]   Fax:  [phone]  Name: Kamyah Wilhelmsen MRN: [id number] Date of Birth: [birth year]  Raeford Razor, PT 03/27/21 5:03 PM Phone: [phone] Fax: [phone]

## 2021-04-02 ENCOUNTER — Other Ambulatory Visit: Payer: Self-pay

## 2021-04-02 ENCOUNTER — Ambulatory Visit: Payer: 59 | Admitting: Physical Therapy

## 2021-04-02 ENCOUNTER — Encounter: Payer: Self-pay | Admitting: Physical Therapy

## 2021-04-02 DIAGNOSIS — M24552 Contracture, left hip: Secondary | ICD-10-CM

## 2021-04-02 DIAGNOSIS — M25552 Pain in left hip: Secondary | ICD-10-CM | POA: Diagnosis not present

## 2021-04-02 DIAGNOSIS — M6281 Muscle weakness (generalized): Secondary | ICD-10-CM

## 2021-04-02 NOTE — Patient Instructions (Signed)
Access Code: [email or web address] URL: [email or web address] Date: 04/02/2021 Prepared by: Raeford Razor  Exercises [place] on Table - 2 x daily - 7 x weekly - 1 sets - 5 reps - 30 hold Pilates Bridge - 1 x daily - 7 x weekly - 2 sets - 10 reps - 5 hold Bridge with Hip Abduction and Resistance - 1 x daily - 7 x weekly - 2 sets - 10-15 reps Clamshell with Resistance - 1 x daily - 7 x weekly - 2 sets - 10-15 reps - 3 hold Supine Dead Bug with Leg Extension - 1 x daily - 7 x weekly - 2 sets - 10 reps - 3 hold Supine Dead Bug with Leg Extension - 1 x daily - 7 x weekly - 2 sets - 10 reps - 3 hold Quadruped Alternating Leg Extensions - 1 x daily - 7 x weekly - 2 sets - 10 reps - 3 hold Quadruped Hip Extension Kicks - 1 x daily - 7 x weekly - 2 sets - 10 reps - 3 hold

## 2021-04-02 NOTE — Therapy (Signed)
[place], [place], [address] Phone: [phone]   Fax:  [phone]  Physical Therapy Treatment  Patient Details  Name: [name] [name] MRN: [id number] Date of Birth: 10/09/1961 Referring Provider (PT): Dr. Lilia Argue   Encounter Date: 04/02/2021   PT End of Session - 04/02/21 1123    Visit Number 5    Number of Visits 8    Date for PT Re-Evaluation 04/30/21    Authorization Type Aetna    PT Start Time 1055   late check in   PT Stop Time 1142    PT Time Calculation (min) 47 min    Activity Tolerance Patient tolerated treatment well    Behavior During Therapy [facility] for tasks assessed/performed           Past Medical History:  Diagnosis Date  . Anemia    FALL 2015 - IRON INFUSIONS X2 NOV 2015 - HX OF HEAVY MENSES / UTERINE FIBROIDS  . Arthritis    osteoarthritis- hips/back  . Complication of anesthesia    SPINAL FOR HIP REPLACMENT - BLOOD PRESSURE DROPPED SEVERELY AND KEPT IN RECOVERY ROOM LONGER THAN NORMAL  . Gallstones    MID CHEST PAIN, SOME BACK PAIN AND RIGHT UPPER ABD PAIN  . GERD (gastroesophageal reflux disease)   . H/O seasonal allergies   . Hiatal hernia 04/01/2005   Dr. Sharlett Iles   . Sinus drainage 10/18/13    Past Surgical History:  Procedure Laterality Date  . CESAREAN SECTION    . CHOLECYSTECTOMY N/A 11/16/2014   Procedure: LAPAROSCOPIC CHOLECYSTECTOMY;  Surgeon: Rolm Bookbinder, MD;  Location: [facility];  Service: General;  Laterality: N/A;  . COLONOSCOPY W/ POLYPECTOMY    . ESOPHAGOGASTRODUODENOSCOPY  04/01/2005   HH and GERD   . TOTAL HIP ARTHROPLASTY Left 10/19/2013   Procedure: LEFT TOTAL HIP ARTHROPLASTY ANTERIOR APPROACH; LEFT;  Surgeon: Mauri Pole, MD;  Location: [facility];  Service: Orthopedics;  Laterality: Left;    There were no vitals filed for this visit.   Subjective Assessment - 04/02/21 1120    Subjective Had no pain or discomfort this AM at all.   Elliptical did increase the hip pain to 2/10.    Currently in Pain? No/denies           [facility] Adult PT Treatment/Exercise - 04/02/21 0001      Pilates   Pilates Mat sst. roll up, down with blue band on feet x 10 , parital more effective for scooping    Other Pilates dead bug variations , core press oblique ith Pilates circle      Knee/Hip Exercises: Stretches   Hip Flexor Stretch Both;3 reps    Piriformis Stretch Limitations standing pigeon x 2 each    Other Knee/Hip Stretches adductors x 3 , 30 sec each      Knee/Hip Exercises: Aerobic   Elliptical 5 min L 10 ramp anf L 1 resist.      Knee/Hip Exercises: Sidelying   Hip ABduction Strengthening;Both;1 set;15 reps    Hip ABduction Limitations no band    Clams x 15 each and band      Knee/Hip Exercises: Prone   Other Prone Exercises q-ped hip extension knee extended and then knee bent x 10 each LE, each ex.            [place] Adult PT Treatment/Exercise - 04/02/21 0001      Pilates   Pilates Mat sst. roll up, down with blue band on  feet x 10 , parital more effective for scooping    Other Pilates dead bug variations , core press oblique ith Pilates circle      Knee/Hip Exercises: Stretches   Hip Flexor Stretch Both;3 reps    Piriformis Stretch Limitations standing pigeon x 2 each    Other Knee/Hip Stretches adductors x 3 , 30 sec each      Knee/Hip Exercises: Aerobic   Elliptical 5 min L 10 ramp anf L 1 resist.      Knee/Hip Exercises: Sidelying   Hip ABduction Strengthening;Both;1 set;15 reps    Hip ABduction Limitations no band    Clams x 15 each and band      Knee/Hip Exercises: Prone   Other Prone Exercises q-ped hip extension knee extended and then knee bent x 10 each LE, each ex.                  PT Education - 04/02/21 1349    Education Details roll up, lower abs, rectus femoris vs Tr A , HEP cues    Person(s) Educated Patient    Methods Explanation;Handout    Comprehension Verbalized  understanding;Verbal cues required            PT Short Term Goals - 03/23/21 1555      PT SHORT TERM GOAL #1   Title Pt will be I with HEP for hips, lower abs    Status On-going      PT SHORT TERM GOAL #2   Title Pt will be able to report less lateral hip pain with walking 2.5 miles, < 2/10    Status On-going      PT SHORT TERM GOAL #3   Title Pt will be able to understand FOTO score and potential to improve    Status On-going             PT Long Term Goals - 03/05/21 1935      PT LONG TERM GOAL #1   Title Pt will be able to lift L leg into the car without the needs for UE and no pain    Time 8    Period Weeks    Status New    Target Date 04/30/21      PT LONG TERM GOAL #2   Title pt will be able to perform single leg stretch (Pilates ab series) and squat without pain in L hip flexor    Time 8    Period Weeks    Status New    Target Date 04/30/21      PT LONG TERM GOAL #3   Title Pt will be I with HEP    Time 8    Period Weeks    Status New    Target Date 04/30/21      PT LONG TERM GOAL #4   Title FOTO score TBD    Time 8    Period Weeks    Status New    Target Date 04/30/21      PT LONG TERM GOAL #5   Title Pt will be able to walk 3 miles without increasing L hip pain    Time 8    Period Weeks    Status New    Target Date 04/30/21                 Plan - 04/02/21 1127    Clinical Impression Statement Pt cont to have min pain with roll up, down. She lacks  spinal flexion and lower abdominal strength/stability, subsititutes with hip flexion, back arches at times. She will do her HEP for a week on her own and return for DN to L psoas. Cont POC    PT Treatment/Interventions ADLs/Self Care Home Management;Cryotherapy;Iontophoresis 4mg /ml Dexamethasone;Moist Heat;Ultrasound;Electrical Stimulation;Therapeutic activities;Therapeutic exercise;Balance training;Functional mobility training;Taping;Patient/family education;Manual techniques;Passive range of  motion;Dry needling    PT Next Visit Plan HEP check  quadruped for hips , low abs, core HEP    PT Home Exercise Plan [id number]    Consulted and Agree with Plan of Care Patient           Patient will benefit from skilled therapeutic intervention in order to improve the following deficits and impairments:  Decreased mobility,Pain,Impaired flexibility,Increased fascial restricitons,Decreased strength,Decreased range of motion  Visit Diagnosis: Pain in left hip  Left hip flexor tightness  Muscle weakness (generalized)     Problem List Patient Active Problem List   Diagnosis Date Noted  . Bone spur of posterior portion of left calcaneus 08/12/2019  . Calcific supraspinatus tendinitis 02/27/2017  . Osteoarthritis of right hip 09/03/2016  . Abdominal pain, epigastric 08/11/2014  . Microcytic anemia 08/11/2014  . Expected blood loss anemia 10/20/2013  . Overweight (BMI 25.0-29.9) 10/20/2013  . 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 ABDOMINAL PAIN, GENERALIZED 03/22/2008  . URI 11/26/2007  . CONTUSION, RIGHT FOOT 07/14/2007  . RHINITIS, ALLERGIC 01/08/2007    Orrie Schubert 04/02/2021, 1:56 PM  [facility] [address] [place], [place], [address] Phone: [phone]   Fax:  [phone]  Name: [name] [name] MRN: [id number] Date of Birth: 05/31/1961  Raeford Razor, PT 04/02/21 1:56 PM Phone: [phone] Fax: [phone]

## 2021-04-10 ENCOUNTER — Encounter: Payer: Self-pay | Admitting: Physical Therapy

## 2021-04-13 ENCOUNTER — Ambulatory Visit: Payer: 59 | Admitting: Physical Therapy

## 2021-04-16 ENCOUNTER — Encounter: Payer: Self-pay | Admitting: Physical Therapy

## 2021-04-16 ENCOUNTER — Other Ambulatory Visit: Payer: Self-pay

## 2021-04-16 ENCOUNTER — Ambulatory Visit: Payer: 59 | Attending: Sports Medicine | Admitting: Physical Therapy

## 2021-04-16 DIAGNOSIS — M25552 Pain in left hip: Secondary | ICD-10-CM | POA: Insufficient documentation

## 2021-04-16 DIAGNOSIS — M24552 Contracture, left hip: Secondary | ICD-10-CM | POA: Diagnosis present

## 2021-04-16 DIAGNOSIS — M6281 Muscle weakness (generalized): Secondary | ICD-10-CM | POA: Diagnosis present

## 2021-04-16 NOTE — Therapy (Signed)
[name], [name], [address] Phone: [phone]   Fax:  [phone]  Physical Therapy Treatment  Patient Details  Name: [name] [name] MRN: [id number] Date of Birth: 07/01/1961 Referring Provider (PT): Dr. Lilia Argue   Encounter Date: 04/16/2021   PT End of Session - 04/16/21 1020    Visit Number 6    Number of Visits 8    Date for PT Re-Evaluation 04/30/21    Authorization Type Aetna    PT Start Time 1020    PT Stop Time 1115    PT Time Calculation (min) 55 min    Activity Tolerance Patient tolerated treatment well    Behavior During Therapy [facility] for tasks assessed/performed           Past Medical History:  Diagnosis Date  . Anemia    FALL 2015 - IRON INFUSIONS X2 NOV 2015 - HX OF HEAVY MENSES / UTERINE FIBROIDS  . Arthritis    osteoarthritis- hips/back  . Complication of anesthesia    SPINAL FOR HIP REPLACMENT - BLOOD PRESSURE DROPPED SEVERELY AND KEPT IN RECOVERY ROOM LONGER THAN NORMAL  . Gallstones    MID CHEST PAIN, SOME BACK PAIN AND RIGHT UPPER ABD PAIN  . GERD (gastroesophageal reflux disease)   . H/O seasonal allergies   . Hiatal hernia 04/01/2005   Dr. Sharlett Iles   . Sinus drainage 10/18/13    Past Surgical History:  Procedure Laterality Date  . CESAREAN SECTION    . CHOLECYSTECTOMY N/A 11/16/2014   Procedure: LAPAROSCOPIC CHOLECYSTECTOMY;  Surgeon: Rolm Bookbinder, MD;  Location: [facility];  Service: General;  Laterality: N/A;  . COLONOSCOPY W/ POLYPECTOMY    . ESOPHAGOGASTRODUODENOSCOPY  04/01/2005   HH and GERD   . TOTAL HIP ARTHROPLASTY Left 10/19/2013   Procedure: LEFT TOTAL HIP ARTHROPLASTY ANTERIOR APPROACH; LEFT;  Surgeon: Mauri Pole, MD;  Location: [facility];  Service: Orthopedics;  Laterality: Left;    There were no vitals filed for this visit.   Subjective Assessment - 04/16/21 1026    Subjective I have had TPDN from [employer].  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    Currently in Pain? Yes    Pain Score 1     Pain Location Groin    Pain Orientation Left;Anterior    Pain Descriptors / Indicators Discomfort              [facility] PT Assessment - 04/16/21 0001      Assessment   Medical Diagnosis L hip pain, chronic    Referring Provider (PT) Dr. Lilia Argue      Posture/Postural Control   Posture Comments elevated R [place] Neighbor  Olevia Bowens LLD                         [facility] Adult PT Treatment/Exercise - 04/16/21 0001      Knee/Hip Exercises: Stretches   Hip Flexor Stretch Both;3 reps    Other Knee/Hip Stretches adductors x 3 , 30 sec each    Other Knee/Hip Stretches QL R stretch in doorway      Modalities   Modalities Moist Heat  Moist Heat Therapy   Number Minutes Moist Heat 15 Minutes    Moist Heat Location Lumbar Spine;Hip   R QL L hip     Manual Therapy   Manual Therapy Myofascial release;Soft tissue mobilization    Manual therapy comments skilled palpation for TPDN    Soft tissue mobilization Left Adductors, Left medial Quad, iliacus . L psoas L TFL    Myofascial Release R QL            Trigger Point Dry Needling - 04/16/21 0001    Consent Given? Yes    Education Handout Provided Yes    Muscles Treated Lower Quadrant Quadriceps;Adductor longus/brevis/magnus;Vastus medialis    Muscles Treated Back/Hip Quadratus lumborum;Iliacus;Tensor fascia lata   R QL and L Iiacus   Dry Needling Comments 50 mm 30 gage  75 mm 30 gage    Quadriceps Response Twitch response elicited;Palpable increased muscle length    Adductor Response Twitch response elicited;Palpable increased muscle length    Vastus medialis Response Twitch response elicited;Palpable increased muscle length    Tensor  Fascia Lata Response Palpable increased muscle length    Iliacus Response Twitch response elicited;Palpable increased muscle length    Quadratus Lumborum Response Twitch response elicited;Palpable increased muscle length   marked response               PT Education - 04/16/21 1023    Education Details education on TPDN aftercare, precautions    Person(s) Educated Patient    Methods Explanation;Demonstration;Tactile cues;Verbal cues;Handout    Comprehension Verbalized understanding;Returned demonstration            PT Short Term Goals - 03/23/21 1555      PT SHORT TERM GOAL #1   Title Pt will be I with HEP for hips, lower abs    Status On-going      PT SHORT TERM GOAL #2   Title Pt will be able to report less lateral hip pain with walking 2.5 miles, < 2/10    Status On-going      PT SHORT TERM GOAL #3   Title Pt will be able to understand FOTO score and potential to improve    Status On-going             PT Long Term Goals - 03/05/21 1935      PT LONG TERM GOAL #1   Title Pt will be able to lift L leg into the car without the needs for UE and no pain    Time 8    Period Weeks    Status New    Target Date 04/30/21      PT LONG TERM GOAL #2   Title pt will be able to perform single leg stretch (Pilates ab series) and squat without pain in L hip flexor    Time 8    Period Weeks    Status New    Target Date 04/30/21      PT LONG TERM GOAL #3   Title Pt will be I with HEP    Time 8    Period Weeks    Status New    Target Date 04/30/21      PT LONG TERM GOAL #4   Title FOTO score TBD    Time 8    Period Weeks    Status New    Target Date 04/30/21      PT LONG TERM GOAL #5   Title Pt will be able to walk 3  miles without increasing L hip pain    Time 8    Period Weeks    Status New    Target Date 04/30/21                 Plan - 04/16/21 1020    Clinical Impression Statement [name] presents with elevated R pelvic level and tightend R  QL and with marked  TTP for left Adductors and quadriceps medialis and R QL  Pt educated and consented to [facility] for trigger points in all muscles needled.  Pt had decreased of pain after TPDN in R QL and and Left iliacus.  Pt with decreased tissue tension in Left Adductors and quadriceps medialis.   Will continue for TPDN trial for decreased in sharp groin/ addcutor/quad pain  Will continue with  Pilates/progressive strength and TPDN as needed to alleviate discomfort.    Personal Factors and Comorbidities Time since onset of injury/illness/exacerbation;Comorbidity 2    Comorbidities THA bilateral 2014 and 2017 , achillles repair    Examination-Activity Limitations Transfers;Locomotion Level    Examination-Participation Restrictions Community Activity;Interpersonal Relationship    Stability/Clinical Decision Making Evolving/Moderate complexity    PT Frequency 1x / week    PT Duration 8 weeks    PT Treatment/Interventions ADLs/Self Care Home Management;Cryotherapy;Iontophoresis 4mg /ml Dexamethasone;Moist Heat;Ultrasound;Electrical Stimulation;Therapeutic activities;Therapeutic exercise;Balance training;Functional mobility training;Taping;Patient/family education;Manual techniques;Passive range of motion;Dry needling    PT Next Visit Plan HEP check  quadruped for hips , low abs, core HEP  Please extend time / ERO for additional TPDN as needed    PT Home Exercise Plan [id number]    Consulted and Agree with Plan of Care Patient           Patient will benefit from skilled therapeutic intervention in order to improve the following deficits and impairments:  Decreased mobility,Pain,Impaired flexibility,Increased fascial restricitons,Decreased strength,Decreased range of motion  Visit Diagnosis: Pain in left hip  Left hip flexor tightness  Muscle weakness (generalized)     Problem List Patient Active Problem List   Diagnosis Date Noted  . Bone spur of posterior portion of left calcaneus  08/12/2019  . Calcific supraspinatus tendinitis 02/27/2017  . Osteoarthritis of right hip 09/03/2016  . Abdominal pain, epigastric 08/11/2014  . Microcytic anemia 08/11/2014  . Expected blood loss anemia 10/20/2013  . Overweight (BMI 25.0-29.9) 10/20/2013  . 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 ABDOMINAL PAIN, GENERALIZED 03/22/2008  . URI 11/26/2007  . CONTUSION, RIGHT FOOT 07/14/2007  . RHINITIS, ALLERGIC 01/08/2007   Voncille Lo, PT, [place] Certified Exercise Expert for the Aging Adult  04/16/21 1:27 PM Phone: [phone] Fax: [place] [facility] [address] [place], [name], [address] Phone: [phone]   Fax:  [phone]  Name: [name] [name] MRN: [id number] Date of Birth: 04-15-1961

## 2021-04-16 NOTE — Patient Instructions (Addendum)
Trigger Point Dry Needling  . What is Trigger Point Dry Needling (DN)? o DN is a physical therapy technique used to treat muscle pain and dysfunction. Specifically, DN helps deactivate muscle trigger points (muscle knots).  o A thin filiform needle is used to penetrate the skin and stimulate the underlying trigger point. The goal is for a local twitch response (LTR) to occur and for the trigger point to relax. No medication of any kind is injected during the procedure.   . What Does Trigger Point Dry Needling Feel Like?  o The procedure feels different for each individual patient. Some patients report that they do not actually feel the needle enter the skin and overall the process is not painful. Very mild bleeding may occur. However, many patients feel a deep cramping in the muscle in which the needle was inserted. This is the local twitch response.   Marland Kitchen How Will I feel after the treatment? o Soreness is normal, and the onset of soreness may not occur for a few hours. Typically this soreness does not last longer than two days.  o Bruising is uncommon, however; ice can be used to decrease any possible bruising.  o In rare cases feeling tired or nauseous after the treatment is normal. In addition, your symptoms may get worse before they get better, this period will typically not last longer than 24 hours.   . What Can I do After My Treatment? o Increase your hydration by drinking more water for the next 24 hours. o You may place ice or heat on the areas treated that have become sore, however, do not use heat on inflamed or bruised areas. Heat often brings more relief post needling. o You can continue your regular activities, but vigorous activity is not recommended initially after the treatment for 24 hours. o DN is best combined with other physical therapy such as strengthening, stretching, and other therapies.    Voncille Lo, PT, [place] Certified Exercise Expert for the Aging Adult   04/16/21 10:23 AM Phone: [phone] Fax: [phone]

## 2021-04-24 ENCOUNTER — Ambulatory Visit: Payer: 59 | Admitting: Physical Therapy

## 2021-04-24 ENCOUNTER — Other Ambulatory Visit: Payer: Self-pay

## 2021-04-24 ENCOUNTER — Encounter: Payer: Self-pay | Admitting: Physical Therapy

## 2021-04-24 DIAGNOSIS — M24552 Contracture, left hip: Secondary | ICD-10-CM

## 2021-04-24 DIAGNOSIS — M25552 Pain in left hip: Secondary | ICD-10-CM | POA: Diagnosis not present

## 2021-04-24 DIAGNOSIS — M6281 Muscle weakness (generalized): Secondary | ICD-10-CM

## 2021-04-24 NOTE — Therapy (Signed)
[place], [place], [address] Phone: [phone]   Fax:  [phone]  Physical Therapy Treatment  Patient Details  Name: [name] [name] MRN: [id number] Date of Birth: May 24, 1961 Referring Provider (PT): Dr. Lilia Argue   Encounter Date: 04/24/2021   PT End of Session - 04/24/21 1026     Visit Number 7    Number of Visits 11    Date for PT Re-Evaluation 05/22/21    Authorization Type [employer]    PT Start Time 1000    PT Stop Time 0037    PT Time Calculation (min) 45 min    Activity Tolerance Patient tolerated treatment well    Behavior During Therapy WFL for tasks assessed/performed             Past Medical History:  Diagnosis Date   Anemia    FALL 2015 - IRON INFUSIONS X2 NOV 2015 - HX OF HEAVY MENSES / UTERINE FIBROIDS   Arthritis    osteoarthritis- hips/back   Complication of anesthesia    SPINAL FOR HIP REPLACMENT - BLOOD PRESSURE DROPPED SEVERELY AND KEPT IN RECOVERY ROOM LONGER THAN NORMAL   Gallstones    MID CHEST PAIN, SOME BACK PAIN AND RIGHT UPPER ABD PAIN   GERD (gastroesophageal reflux disease)    H/O seasonal allergies    Hiatal hernia 04/01/2005   Dr. Sharlett Iles    Sinus drainage 10/18/13    Past Surgical History:  Procedure Laterality Date   CESAREAN SECTION     CHOLECYSTECTOMY N/A 11/16/2014   Procedure: LAPAROSCOPIC CHOLECYSTECTOMY;  Surgeon: Rolm Bookbinder, MD;  Location: [facility];  Service: General;  Laterality: N/A;   COLONOSCOPY W/ POLYPECTOMY     ESOPHAGOGASTRODUODENOSCOPY  04/01/2005   HH and GERD    TOTAL HIP ARTHROPLASTY Left 10/19/2013   Procedure: LEFT TOTAL HIP ARTHROPLASTY ANTERIOR APPROACH; LEFT;  Surgeon: Mauri Pole, MD;  Location: [facility];  Service: Orthopedics;  Laterality: Left;    There were no vitals filed for this visit.   Subjective Assessment - 04/24/21 0959     Subjective I feel like it might take 2-3 times to notice a difference (DN) .  She  did not do pilates on Friday to test it.  Did it yesterday and had no increased pain .  I am aware of it but it doesnt hurt.    Currently in Pain? Yes    Pain Score 1     Pain Location Groin    Pain Orientation Left;Anterior    Pain Descriptors / Indicators Discomfort    Pain Type Chronic pain    Pain Onset More than a month ago    Pain Frequency Constant    Aggravating Factors  teaser    Pain Relieving Factors rest, ice OTC                  [facility] Adult PT Treatment/Exercise - 04/24/21 0001       Lumbar Exercises: Stretches   Hip Flexor Stretch 3 reps;30 seconds    Hip Flexor Stretch Limitations bilateral in 1/2 kneeling    Other Lumbar Stretch Exercise quadruped inner thigh stretching , pelvic rocking    Other Lumbar Stretch Exercise sidelying QL over bolster for 30 sec with breathing      Lumbar Exercises: Supine   Other Supine Lumbar Exercises used soft ball for low abs: single leg stretch, SLR with 5 lbs chest hgt    Other Supine Lumbar Exercises double leg stretch  x 10, modified with ball under sacrum      Knee/Hip Exercises: Stretches   Other Knee/Hip Stretches 3 way hip x 1 pass each x 30 sec    Other Knee/Hip Stretches standing QL in door way x 2, 30 sec      Knee/Hip Exercises: Aerobic   Elliptical 5 min L1      Knee/Hip Exercises: Supine   Bridges Limitations ball squeeze with knee extension and ball x 10    Bridges with [employer] Both;1 set;15 reps    Straight Leg Raise with External Rotation Both;1 set;10 reps    Straight Leg Raise with External Rotation Limitations pain in L adductor, hip      Manual Therapy   Manual Therapy Passive ROM    Manual therapy comments Rt hip>L hip    Passive ROM stretch ant capsule , hip ER and IR, quad stretch with thigh support                      PT Short Term Goals - 03/23/21 1555       PT SHORT TERM GOAL #1   Title Pt will be I with HEP for hips, lower abs    Status On-going      PT SHORT TERM  GOAL #2   Title Pt will be able to report less lateral hip pain with walking 2.5 miles, < 2/10    Status On-going      PT SHORT TERM GOAL #3   Title Pt will be able to understand FOTO score and potential to improve    Status On-going               PT Long Term Goals - 03/05/21 1935       PT LONG TERM GOAL #1   Title Pt will be able to lift L leg into the car without the needs for UE and no pain    Time 8    Period Weeks    Status New    Target Date 04/30/21      PT LONG TERM GOAL #2   Title pt will be able to perform single leg stretch (Pilates ab series) and squat without pain in L hip flexor    Time 8    Period Weeks    Status New    Target Date 04/30/21      PT LONG TERM GOAL #3   Title Pt will be I with HEP    Time 8    Period Weeks    Status New    Target Date 04/30/21      PT LONG TERM GOAL #4   Title FOTO score TBD    Time 8    Period Weeks    Status New    Target Date 04/30/21      PT LONG TERM GOAL #5   Title Pt will be able to walk 3 miles without increasing L hip pain    Time 8    Period Weeks    Status New    Target Date 04/30/21                   Plan - 04/24/21 1005     Clinical Impression Statement Pt with minimal pain in Rt groin, adductor very localized discomfort and aggravated by straight leg raises. She was recently able to do Pilates without significant irritation. Extended POC to include 2-3 more vistits for dry needling.  PT Treatment/Interventions ADLs/Self Care Home Management;Cryotherapy;Iontophoresis 4mg /ml Dexamethasone;Moist Heat;Ultrasound;Electrical Stimulation;Therapeutic activities;Therapeutic exercise;Balance training;Functional mobility training;Taping;Patient/family education;Manual techniques;Passive range of motion;Dry needling    PT Next Visit Plan HEP check  quadruped for hips , low abs, core HEP  Please extend time / ERO for additional TPDN as needed    PT Home Exercise Plan [email or web address]    Consulted and  Agree with Plan of Care Patient             Patient will benefit from skilled therapeutic intervention in order to improve the following deficits and impairments:  Decreased mobility, Pain, Impaired flexibility, Increased fascial restricitons, Decreased strength, Decreased range of motion  Visit Diagnosis: Pain in left hip  Left hip flexor tightness  Muscle weakness (generalized)     Problem List Patient Active Problem List   Diagnosis Date Noted   Bone spur of posterior portion of left calcaneus 08/12/2019   Calcific supraspinatus tendinitis 02/27/2017   Osteoarthritis of right hip 09/03/2016   Abdominal pain, epigastric 08/11/2014   Microcytic anemia 08/11/2014   Expected blood loss anemia 10/20/2013   Overweight (BMI 25.0-29.9) 10/20/2013   S/P left THA, 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

## 2021-05-02 ENCOUNTER — Encounter: Payer: 59 | Admitting: Physical Therapy

## 2021-05-03 ENCOUNTER — Ambulatory Visit: Payer: 59 | Admitting: Physical Therapy

## 2021-05-03 ENCOUNTER — Other Ambulatory Visit: Payer: Self-pay

## 2021-05-03 ENCOUNTER — Encounter: Payer: Self-pay | Admitting: Physical Therapy

## 2021-05-03 DIAGNOSIS — M25552 Pain in left hip: Secondary | ICD-10-CM | POA: Diagnosis not present

## 2021-05-03 DIAGNOSIS — M6281 Muscle weakness (generalized): Secondary | ICD-10-CM

## 2021-05-03 DIAGNOSIS — M24552 Contracture, left hip: Secondary | ICD-10-CM

## 2021-05-03 NOTE — Therapy (Signed)
[place], [place], [address] Phone: [phone]   Fax:  [phone]  Physical Therapy Treatment  Patient Details  Name: [name] [name] MRN: [id number] Date of Birth: Jun 29, 1961 Referring Provider (PT): Dr. Lilia Argue   Encounter Date: 05/03/2021   PT End of Session - 05/03/21 0758     Visit Number 8    Number of Visits 11    Date for PT Re-Evaluation 05/22/21    Authorization Type [employer]et[employer]    PT Start Time 0800    PT Stop Time 4193    PT Time Calculation (min) 56 min    Activity Tolerance Patient tolerated treatment well    Behavior During Therapy WFL for tasks assessed/performed             Past Medical History:  Diagnosis Date   Anemia    FALL 2015 - IRON INFUSIONS X2 NOV 2015 - HX OF HEAVY MENSES / UTERINE FIBROIDS   Arthritis    osteoarthritis- hips/back   Complication of anesthesia    SPINAL FOR HIP REPLACMENT - BLOOD PRESSURE DROPPED SEVERELY AND KEPT IN RECOVERY ROOM LONGER THAN NORMAL   Gallstones    MID CHEST PAIN, SOME BACK PAIN AND RIGHT UPPER ABD PAIN   GERD (gastroesophageal reflux disease)    H/O seasonal allergies    Hiatal hernia 04/01/2005   Dr. Sharlett Iles    Sinus drainage 10/18/13    Past Surgical History:  Procedure Laterality Date   CESAREAN SECTION     CHOLECYSTECTOMY N/A 11/16/2014   Procedure: LAPAROSCOPIC CHOLECYSTECTOMY;  Surgeon: Rolm Bookbinder, MD;  Location: [facility];  Service: General;  Laterality: N/A;   COLONOSCOPY W/ POLYPECTOMY     ESOPHAGOGASTRODUODENOSCOPY  04/01/2005   HH and GERD    TOTAL HIP ARTHROPLASTY Left 10/19/2013   Procedure: LEFT TOTAL HIP ARTHROPLASTY ANTERIOR APPROACH; LEFT;  Surgeon: Mauri Pole, MD;  Location: [facility];  Service: Orthopedics;  Laterality: Left;    There were no vitals filed for this visit.   Subjective Assessment - 05/03/21 0802     Subjective When I was in Pilates I was able to do some movement better but when  she was with Guido Sander PT some exercises engaged the pain more so    Limitations Lifting;Walking    How long can you sit comfortably? not limited    How long can you stand comfortably? not limted    How long can you walk comfortably? 2.5 miles  walking 30 min to an hour    Diagnostic tests Xr hip was clear.    Patient Stated Goals I would like to be to a point where I can either resolve the pain or just get a MRI.    Currently in Pain? Yes    Pain Score 1     Pain Location Groin    Pain Orientation Left;Anterior    Pain Descriptors / Indicators Discomfort    Pain Type Chronic pain    Pain Onset More than a month ago    Pain Frequency Constant                               [facility] Adult PT Treatment/Exercise - 05/03/21 0001       Lumbar Exercises: Supine   Straight Leg Raise 10 reps    Straight Leg Raises Limitations bil but Left only able to perform 6      Knee/Hip  Exercises: Stretches   Hip Flexor Stretch Both;3 reps    Other Knee/Hip Stretches 3 way hip x 1 pass each x 30 sec      Knee/Hip Exercises: Supine   Bridges Limitations ball squeeze with knee extension and ball x 10   NO PAIN   Bridges with [employer] Both;1 set;15 reps    Straight Leg Raise with External Rotation Both;1 set;10 reps    Straight Leg Raise with External Rotation Limitations dull pain in L adductor, hip   only able to do 6 on L     Knee/Hip Exercises: Sidelying   Other Sidelying Knee/Hip Exercises modified copenhagen with short lever arm at knee in  r sidelying.  attempt x 5 with abdominal engagement      Modalities   Modalities Moist Heat      Moist Heat Therapy   Number Minutes Moist Heat 15 Minutes    Moist Heat Location Hip   Left     Electrical Stimulation   Electrical Stimulation Location Adductors   Left   Electrical Stimulation Action portable estim unit with alligaotr clips    Electrical Stimulation Parameters to pt tolerance 20 pps    Electrical Stimulation Goals  Pain      Manual Therapy   Manual Therapy Myofascial release;Soft tissue mobilization    Manual therapy comments skilled palpation for TPDN    Soft tissue mobilization Left Adductors, Left medial Quad, IASTYM    Passive ROM stretch ant capsule , hip ER and IR, quad stretch with thigh support              Trigger Point Dry Needling - 05/03/21 0001     Consent Given? Yes    Education Handout Provided Previously provided    Muscles Treated Lower Quadrant Adductor longus/brevis/magnus;Vastus medialis    Dry Needling Comments 50 mm 30 gage  75 mm 30 gage    Other Dry Needling e stim 10 min to Adductors    Adductor Response Twitch response elicited;Palpable increased muscle length    Vastus medialis Response Twitch response elicited;Palpable increased muscle length                  PT Education - 05/03/21 0850     Education Details added e stim to dry needling and a modified copenhagen exercise for Left adductors    Person(s) Educated Patient    Methods Explanation;Demonstration;Handout;Verbal cues;Tactile cues    Comprehension Verbalized understanding;Returned demonstration              PT Short Term Goals - 03/23/21 1555       PT SHORT TERM GOAL #1   Title Pt will be I with HEP for hips, lower abs    Status On-going      PT SHORT TERM GOAL #2   Title Pt will be able to report less lateral hip pain with walking 2.5 miles, < 2/10    Status On-going      PT SHORT TERM GOAL #3   Title Pt will be able to understand FOTO score and potential to improve    Status On-going               PT Long Term Goals - 03/05/21 1935       PT LONG TERM GOAL #1   Title Pt will be able to lift L leg into the car without the needs for UE and no pain    Time 8    Period Weeks  Status New    Target Date 04/30/21      PT LONG TERM GOAL #2   Title pt will be able to perform single leg stretch (Pilates ab series) and squat without pain in L hip flexor    Time 8     Period Weeks    Status New    Target Date 04/30/21      PT LONG TERM GOAL #3   Title Pt will be I with HEP    Time 8    Period Weeks    Status New    Target Date 04/30/21      PT LONG TERM GOAL #4   Title FOTO score TBD    Time 8    Period Weeks    Status New    Target Date 04/30/21      PT LONG TERM GOAL #5   Title Pt will be able to walk 3 miles without increasing L hip pain    Time 8    Period Weeks    Status New    Target Date 04/30/21                   Plan - 05/03/21 1227     Clinical Impression Statement Pt with minimal pain in Left groin, adductor very TTP initially. Pt consented to [facility] for Left Adductor and vastus medialis. and was closely monitored throughout session.  Pt with decreased tenderness post TPDN and pt reported less sharpness of pain.   Pt was aggravated by SLR  R- 10 x but L only able to perform 6.   She is planning to continue wiht Pilates today and see how she tolerates.  Recomment TPDN for one more session to assess benefit .    Personal Factors and Comorbidities Time since onset of injury/illness/exacerbation;Comorbidity 2    Comorbidities THA bilateral 2014 and 2017 , achillles repair    Examination-Activity Limitations Transfers;Locomotion Level    Examination-Participation Restrictions Community Activity;Interpersonal Relationship    PT Frequency 1x / week    PT Duration 8 weeks    PT Treatment/Interventions ADLs/Self Care Home Management;Cryotherapy;Iontophoresis 4mg /ml Dexamethasone;Moist Heat;Ultrasound;Electrical Stimulation;Therapeutic activities;Therapeutic exercise;Balance training;Functional mobility training;Taping;Patient/family education;Manual techniques;Passive range of motion;Dry needling    PT Next Visit Plan HEP check  quadruped for hips , low abs, core HEP  Please extend time / ERO for additional TPDN as needed    PT Home Exercise Plan [email or web address]    Consulted and Agree with Plan of Care Patient             Patient  will benefit from skilled therapeutic intervention in order to improve the following deficits and impairments:  Decreased mobility, Pain, Impaired flexibility, Increased fascial restricitons, Decreased strength, Decreased range of motion  Visit Diagnosis: Pain in left hip  Left hip flexor tightness  Muscle weakness (generalized)     Problem List Patient Active Problem List   Diagnosis Date Noted   Bone spur of posterior portion of left calcaneus 08/12/2019   Calcific supraspinatus tendinitis 02/27/2017   Osteoarthritis of right hip 09/03/2016   Abdominal pain, epigastric 08/11/2014   Microcytic anemia 08/11/2014   Expected blood loss anemia 10/20/2013   Overweight (BMI 25.0-29.9) 10/20/2013   S/P left THA, AA 10/19/2013   ABDOMINAL PAIN, GENERALIZED 03/22/2008   URI 11/26/2007   CONTUSION, RIGHT FOOT 07/14/2007   RHINITIS, ALLERGIC 01/08/2007   Voncille Lo, PT, [place] Certified Exercise Expert for the Aging Adult  05/03/21 12:32 PM Phone: [phone] Fax: [phone]  [place], [place], [address] Phone: [phone]   Fax:  [phone]  Name: [name] [name] MRN: [id number] Date of Birth: [birth year]

## 2021-05-03 NOTE — Patient Instructions (Signed)
Add Modified [place] with  bolster with knee contact , not ankle contact.  Hold for 5-10 sec as able 5-10 x as shown in clinic to strengthen Left Adductors  Voncille Lo, PT, [place] Certified Exercise Expert for the Aging Adult  05/03/21 8:48 AM Phone: [phone] Fax: [phone]

## 2021-05-10 ENCOUNTER — Other Ambulatory Visit: Payer: Self-pay

## 2021-05-10 ENCOUNTER — Encounter: Payer: Self-pay | Admitting: Physical Therapy

## 2021-05-10 ENCOUNTER — Ambulatory Visit: Payer: 59 | Admitting: Physical Therapy

## 2021-05-10 DIAGNOSIS — M25552 Pain in left hip: Secondary | ICD-10-CM | POA: Diagnosis not present

## 2021-05-10 DIAGNOSIS — M24552 Contracture, left hip: Secondary | ICD-10-CM

## 2021-05-10 NOTE — Therapy (Signed)
[address] [place], [place], [address] Phone: [phone]   Fax:  [phone]  Physical Therapy Treatment/Discharge Note  Patient Details  Name: [name] [name] MRN: [id number] Date of Birth: 04-Mar-1961 Referring Provider (PT): Dr. Lilia Argue   Encounter Date: 05/10/2021   PT End of Session - 05/10/21 0939     Visit Number 9    Number of Visits 11    Date for PT Re-Evaluation 05/22/21    Authorization Type [employer]    PT Start Time 0930    PT Stop Time 1030    PT Time Calculation (min) 60 min    Activity Tolerance Patient tolerated treatment well    Behavior During Therapy WFL for tasks assessed/performed             Past Medical History:  Diagnosis Date   Anemia    FALL 2015 - IRON INFUSIONS X2 NOV 2015 - HX OF HEAVY MENSES / UTERINE FIBROIDS   Arthritis    osteoarthritis- hips/back   Complication of anesthesia    SPINAL FOR HIP REPLACMENT - BLOOD PRESSURE DROPPED SEVERELY AND KEPT IN RECOVERY ROOM LONGER THAN NORMAL   Gallstones    MID CHEST PAIN, SOME BACK PAIN AND RIGHT UPPER ABD PAIN   GERD (gastroesophageal reflux disease)    H/O seasonal allergies    Hiatal hernia 04/01/2005   Dr. Sharlett Iles    Sinus drainage 10/18/13    Past Surgical History:  Procedure Laterality Date   CESAREAN SECTION     CHOLECYSTECTOMY N/A 11/16/2014   Procedure: LAPAROSCOPIC CHOLECYSTECTOMY;  Surgeon: Rolm Bookbinder, MD;  Location: [facility];  Service: General;  Laterality: N/A;   COLONOSCOPY W/ POLYPECTOMY     ESOPHAGOGASTRODUODENOSCOPY  04/01/2005   HH and GERD    TOTAL HIP ARTHROPLASTY Left 10/19/2013   Procedure: LEFT TOTAL HIP ARTHROPLASTY ANTERIOR APPROACH; LEFT;  Surgeon: Mauri Pole, MD;  Location: [facility];  Service: Orthopedics;  Laterality: Left;    There were no vitals filed for this visit.   Subjective Assessment - 05/10/21 0933     Subjective My left pain is getting duller and not as sharp.  when  I do a roll down/ teaser in Pilates I dont feel like I need to stop.   Since yesterday, I feel like I can go on with the needling and strengthening. I was able to do the  [place] modified on the reformer  wiht right  leg on the foot bar.    Limitations Lifting;Walking    How long can you sit comfortably? not limited    How long can you stand comfortably? not limted    How long can you walk comfortably? 3 miles  walking 30 min to an hour  19 min mile    Diagnostic tests Xr hip was clear.    Patient Stated Goals I would like to be to a point where I can either resolve the pain or just get a MRI.    Currently in Pain? Yes    Pain Score 1     Pain Location Groin    Pain Orientation Left;Anterior    Pain Descriptors / Indicators Discomfort    Pain Type Chronic pain    Pain Onset More than a month ago    Pain Frequency Intermittent                               [facility] Adult  PT Treatment/Exercise - 05/10/21 0001       Self-Care   Self-Care Other Self-Care Comments    Other Self-Care Comments  community wellness opprtunities continuing Pilates and walking [place]      Knee/Hip Exercises: Supine   Bridges with [employer] Both;1 set;15 reps    Straight Leg Raise with External Rotation Both;1 set;10 reps    Other Supine Knee/Hip Exercises supine resisted fleixon with red t band      Knee/Hip Exercises: Sidelying   Other Sidelying Knee/Hip Exercises modified copenhagen with short lever arm at knee in  r sidelying.  attempt x 5 with abdominal engagement      Modalities   Modalities Moist Heat      Moist Heat Therapy   Number Minutes Moist Heat 15 Minutes    Moist Heat Location Hip   Left     Electrical Stimulation   Electrical Stimulation Location Adductors   Left   Electrical Stimulation Action portable estim unit with alligator clips    Electrical Stimulation Parameters to pt tolerance 20pps    Electrical Stimulation Goals Pain      Manual Therapy    Manual Therapy Myofascial release;Soft tissue mobilization    Manual therapy comments skilled palpation for TPDN    Soft tissue mobilization Left Adductors, Left medial Quad, IASTYM    Passive ROM stretch ant capsule , hip ER and IR, quad stretch with thigh support                    PT Education - 05/10/21 0947     Education Details added supine resisted flexion and reviewed HEP and community wellness    Person(s) Educated Patient    Methods Explanation;Demonstration;Tactile cues;Verbal cues;Handout    Comprehension Verbalized understanding;Returned demonstration              PT Short Term Goals - 05/10/21 1017       PT SHORT TERM GOAL #1   Title Pt will be I with HEP for hips, lower abs    Time 4    Period Weeks    Status Achieved      PT SHORT TERM GOAL #2   Title Pt will be able to report less lateral hip pain with walking 2.5 miles, < 2/10    Baseline walk 3 to 4 miles  19 min mile    Time 4    Period Weeks    Status Achieved    Target Date 04/02/21      PT SHORT TERM GOAL #3   Title Pt will be able to understand FOTO score and potential to improve    Baseline 05-10-21 91 FOTO    Time 4    Period Weeks    Status Achieved               PT Long Term Goals - 05/10/21 1018       PT LONG TERM GOAL #1   Title Pt will be able to lift L leg into the car without the needs for UE and no pain    Time 8    Period Weeks    Status Achieved      PT LONG TERM GOAL #2   Title pt will be able to perform single leg stretch (Pilates ab series) and squat without pain in L hip flexor    Baseline Pt going to Pilates and able to squat    Time 8    Period Weeks  Status Achieved      PT LONG TERM GOAL #3   Title Pt will be I with HEP    Time 8    Period Weeks    Status Achieved      PT LONG TERM GOAL #4   Title FOTO score TBD    Baseline FOTO improved to 91%    Time 8    Period Weeks    Status Achieved      PT LONG TERM GOAL #5   Title Pt will be  able to walk 3 miles without increasing L hip pain    Baseline able to walk 3 miles 19 min -59mn mile    Time 8    Period Weeks    Status Achieved                   Plan - 05/10/21 1221     Clinical Impression Statement [name] well to TPDN and sharp groin pain now reduced to dull pain/discomfort.  Pt has been able to participate in Pilates classes and full motion without having to stop due to groin pain now.  Pt has plan to continue PIlates/walking in park  ( 3 miles  19 min mile).  Pt has been informed of community wellness opportuniites to continue strengtheining post RX today.  All goals achieved STG/LTG.  Pt with trigger point pain in adductor magnus and adductors with reduction in sharp pain  causing compensatory LE movements.   Pt will be DC due to being pleased with current progress and has plan in place to continue strengthening post PT. Ms PBehlis a joy for whom to serve.    Personal Factors and Comorbidities Time since onset of injury/illness/exacerbation;Comorbidity 2    Comorbidities THA bilateral 2014 and 2017 , achillles repair    Examination-Participation Restrictions Community Activity;Interpersonal Relationship    Rehab Potential Excellent    PT Frequency 1x / week    PT Duration 8 weeks    PT Treatment/Interventions ADLs/Self Care Home Management;Cryotherapy;Iontophoresis 427mml Dexamethasone;Moist Heat;Ultrasound;Electrical Stimulation;Therapeutic activities;Therapeutic exercise;Balance training;Functional mobility training;Taping;Patient/family education;Manual techniques;Passive range of motion;Dry needling    PT Next Visit Plan DC    PT Home Exercise Plan [id number]  Consulted and Agree with Plan of Care Patient             Patient will benefit from skilled therapeutic intervention in order to improve the following deficits and impairments:  Decreased mobility, Pain, Impaired flexibility, Increased fascial restricitons, Decreased strength,  Decreased range of motion  Visit Diagnosis: Pain in left hip  Left hip flexor tightness     Problem List Patient Active Problem List   Diagnosis Date Noted   Bone spur of posterior portion of left calcaneus 08/12/2019   Calcific supraspinatus tendinitis 02/27/2017   Osteoarthritis of right hip 09/03/2016   Abdominal pain, epigastric 08/11/2014   Microcytic anemia 08/11/2014   Expected blood loss anemia 10/20/2013   Overweight (BMI 25.0-29.9) 10/20/2013   S/P left THA, AA 10/19/2013   ABDOMINAL PAIN, GENERALIZED 03/22/2008   URI 11/26/2007   CONTUSION, RIGHT FOOT 07/14/2007   RHINITIS, ALLERGIC 01/08/2007    LaVoncille LoPT, ATSt. Louisertified Exercise Expert for the Aging Adult  05/10/21 12:29 PM Phone: [phone]: 33[place]one: [phone] Fax:  [phone]: MaEmmalou HungerRN: [id number] of Birth: [birth year]PHYSICAL THERAPY DISCHARGE SUMMARY  Visits from  Start of Care: 9  Current functional level related to goals / functional outcomes: As above   Remaining deficits: Minimal adductor pain now dull , no longer sharp   Education / Equipment: HEP and community wellness   Patient agrees to discharge. Patient goals were met. Patient is being discharged due to being pleased with the current functional level.  And achievement of all goals.  Voncille Lo, PT, [place] Certified Exercise Expert for the Aging Adult  05/10/21 12:29 PM Phone: [phone] Fax: [phone]

## 2021-05-10 NOTE — Patient Instructions (Signed)
   Voncille Lo, PT, [place] Certified Exercise Expert for the Aging Adult  05/10/21 9:46 AM Phone: [phone] Fax: [phone]

## 2021-06-21 ENCOUNTER — Other Ambulatory Visit: Payer: Self-pay

## 2021-06-21 ENCOUNTER — Encounter: Payer: Self-pay | Admitting: Sports Medicine

## 2021-06-21 ENCOUNTER — Ambulatory Visit: Payer: 59 | Admitting: Sports Medicine

## 2021-06-21 VITALS — BP 116/72 | Ht 64.0 in | Wt 135.0 lb

## 2021-06-21 DIAGNOSIS — M7530 Calcific tendinitis of unspecified shoulder: Secondary | ICD-10-CM | POA: Diagnosis not present

## 2021-06-21 MED ORDER — AMITRIPTYLINE HCL 25 MG PO TABS: 25.0000 mg | ORAL_TABLET | Freq: Every day | ORAL | 3 refills | Status: DC

## 2021-06-21 NOTE — Progress Notes (Addendum)
   [name] [name] is a 60 y.o. 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  She is feeling much better with minimal pain and increased ROM. Pain mostly occurs when reaching for heavy objects out of the cabinet. She has been consistent with pilates and strengthening her rotator cuff. She was given NTG patches and prednisone at her last appointment though she is unsure if she took the prednisone, felt NTG patches helped. She also did dry needling with PT a few weeks after her last appointment with improvement in pain. She continues on amitriptyline and nightly CBD oil.   She does a regular Pilates class with [name] and feels this helps  PMH reviewed.  ROS as above. Medications reviewed.  Exam:  BP 116/72   Ht '5\' 4"'$  (1.626 m)   Wt 135 lb (61.2 kg)   BMI 23.17 kg/m  Gen: Well NAD MSK:  Inspection: no gross abnormalities, erythema or overlying skin changes Palpation: nonTTP diffusely ROM: grossly preserved ROM with abduction, flexion though limited in internal rotation Strength: 5/5 strength in resisted internal/external rotation, abduction, flexion Stability: no joint laxity Special Tests: negative empty can, positive Hawkins Neurovascular: intact   Ultrasound of R Shoulder  Chronic calcifications again seen in supraspinatus tendon, more at tendinous insertion on humeral head. No tears within tendon seen. Other shoulder views including biceps tendon, infraspinatus, subscapularis wnl.  Ultrasound and interpretation by Dr. Ky Barban and Dr. Oneida Alar.  Assessment and Plan: 1) Calcific supraspinatus tendinitis Improvement s/p regular amitriptyline use and rotator cuff strengthening. Ultrasound demonstrates persistence but no apparent worsening of calcifications. Given improvement, will trial decrease of  amitriptyline to every other day, then every third day as tolerated. Refill provided. Continue strengthening exercises. F/u prn.    Rory Percy, DO PGY-4 [place] Ultrasound Fellow 06/21/2021 3:47 PM I observed and examined the patient with the [place] resident and agree with assessment and plan.  Note reviewed and modified by me. Ila Mcgill, MD

## 2021-06-21 NOTE — Assessment & Plan Note (Signed)
Improvement s/p regular amitriptyline use and rotator cuff strengthening. Ultrasound demonstrates persistence but no apparent worsening of calcifications. Given improvement, will trial decrease of amitriptyline to every other day, then every third day as tolerated. Refill provided. Continue strengthening exercises. F/u prn.

## 2021-06-21 NOTE — Patient Instructions (Signed)
It was great to see you!  Our plans for today:  - Try reducing your amitriptyline to every other day for a month. If you do ok with this, try reducing to taking every third night for a month.  - We sent a refill of your amitriptyline to your pharmacy.  Take care and seek immediate care sooner if you develop any concerns.   Dr. Ky Barban

## 2021-06-29 ENCOUNTER — Other Ambulatory Visit: Payer: Self-pay | Admitting: Sports Medicine

## 2021-10-17 IMAGING — CR DG HIP (WITH OR WITHOUT PELVIS) 2-3V*L*
3 series · 3 of 3 positions shown · non-contrast
Comparison: 10/19/2013

CLINICAL DATA: Chronic left hip pain

EXAM:
DG HIP (WITH OR WITHOUT PELVIS) 3V LEFT

[w pelvis * (1 of 3)]
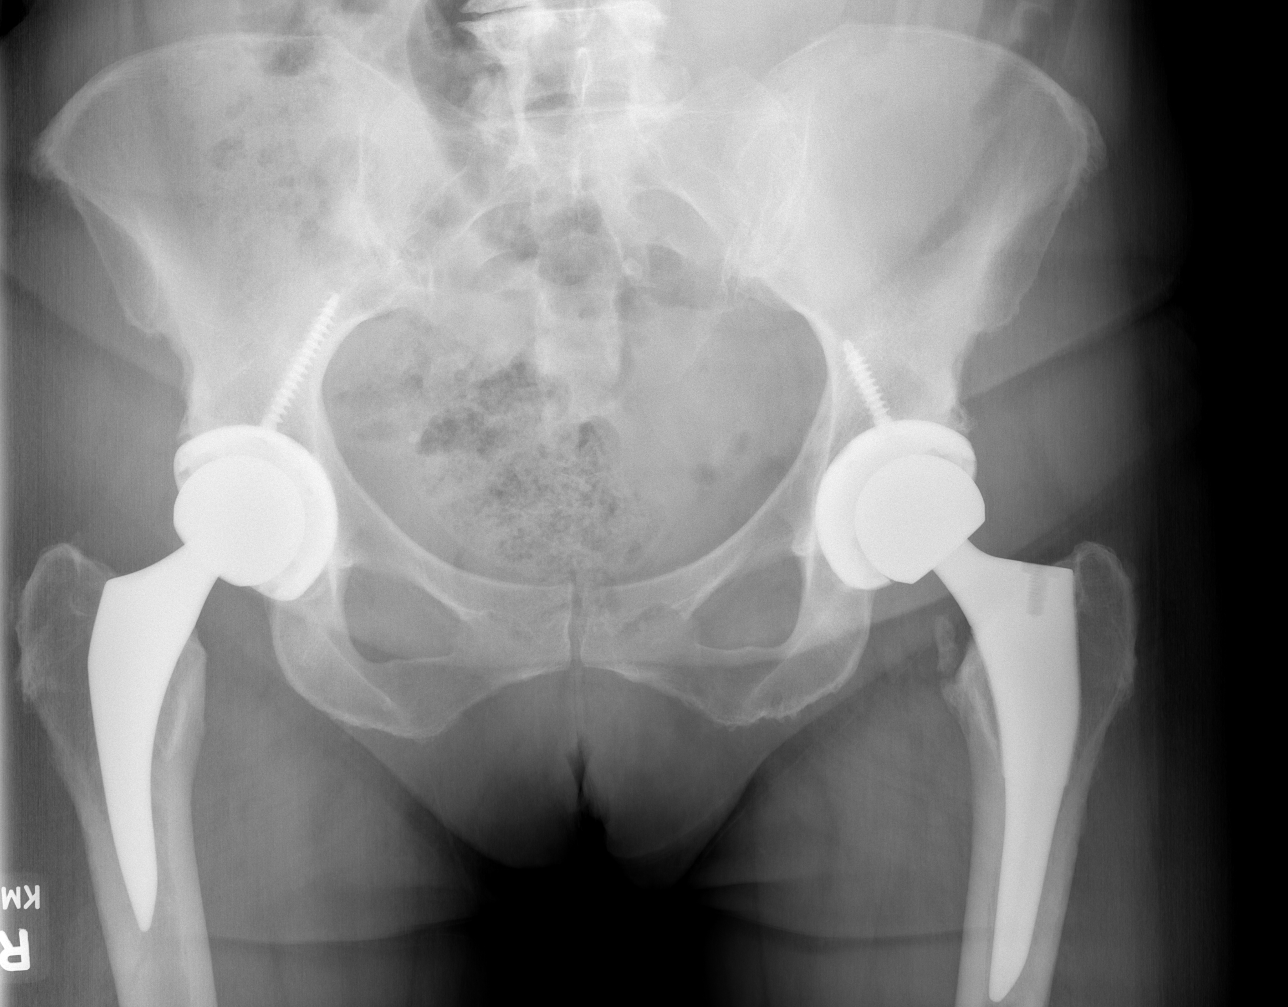

[w pelvis * (2 of 3)]
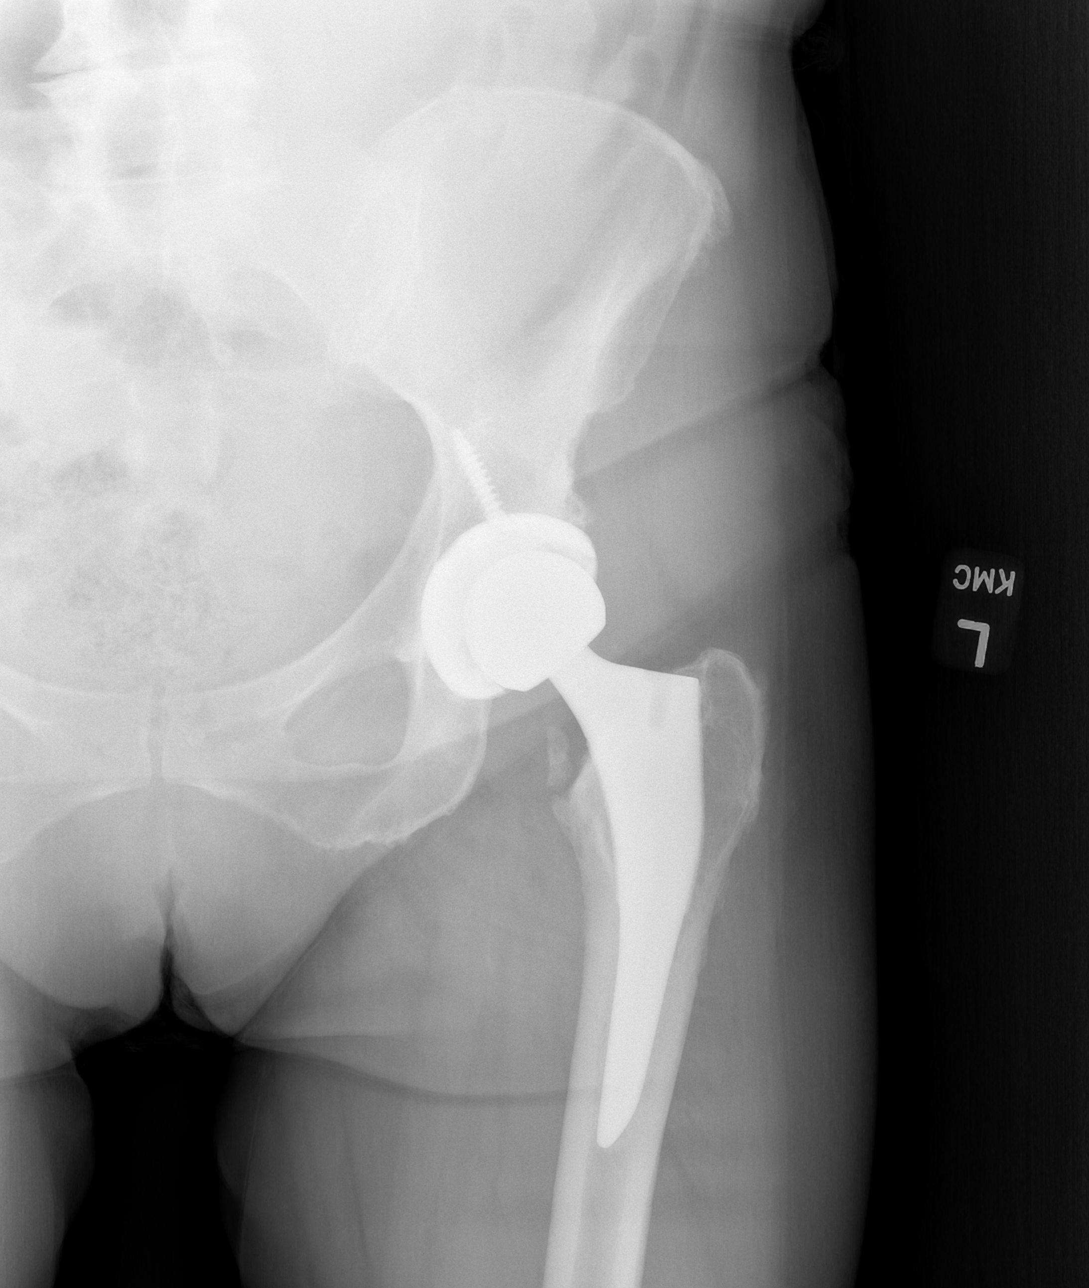

[w pelvis * (3 of 3)]
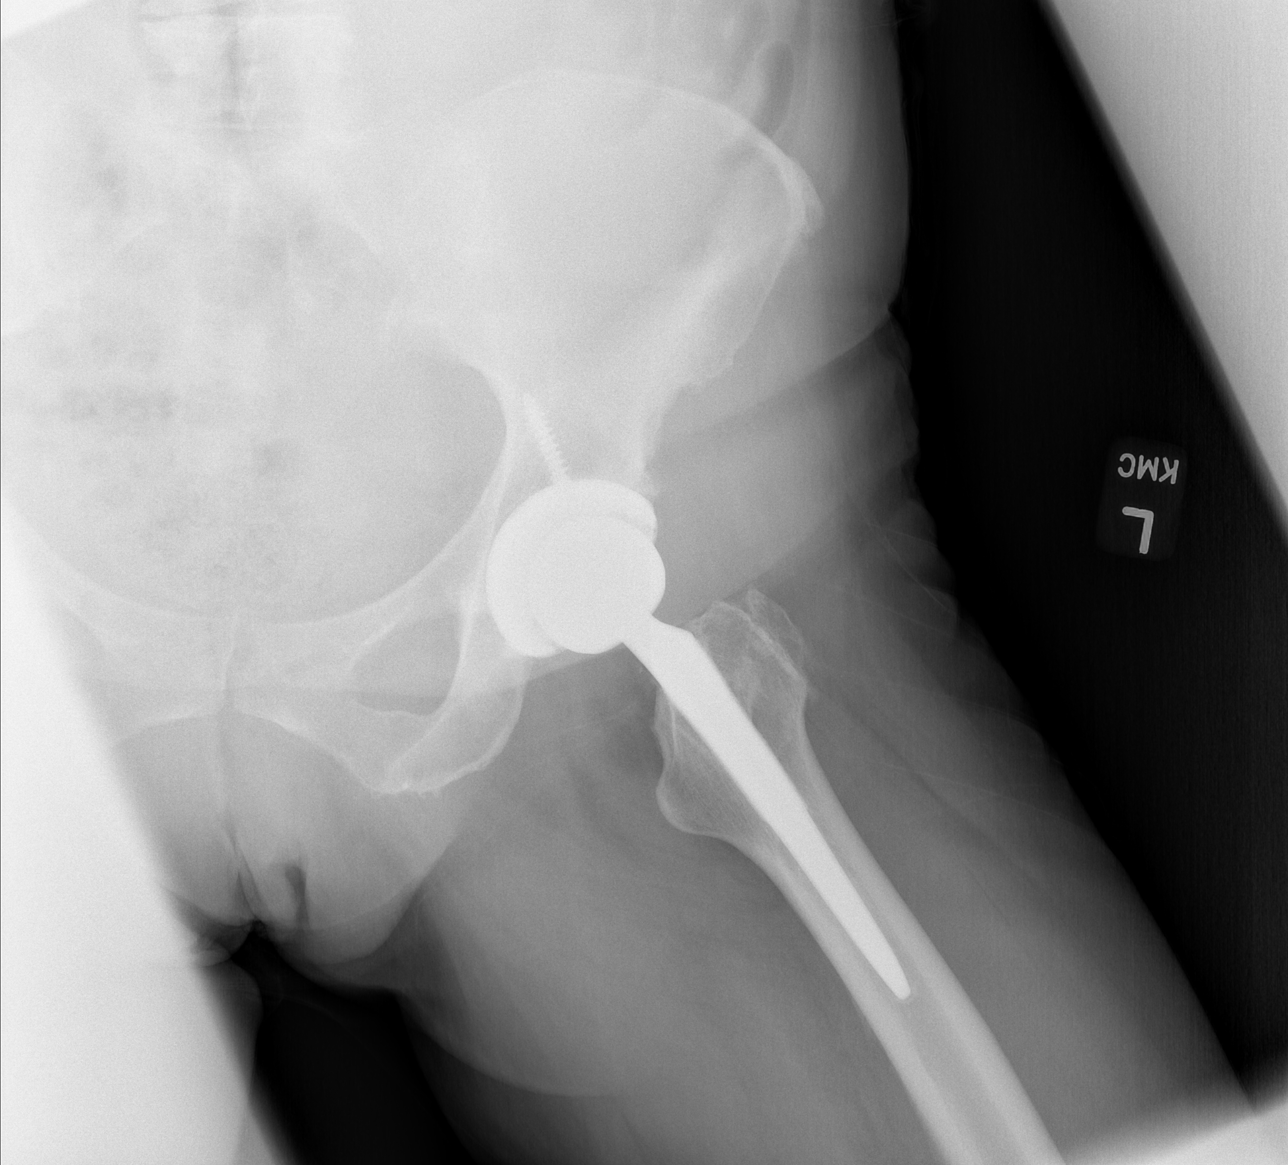

[3 of 3 positions shown; findings below may reference images not displayed]

FINDINGS: Interval right hip replacement has been performed. Stable left hip
prosthesis is noted. Pelvic ring is intact. No acute fracture or
dislocation is noted.
IMPRESSION: Status post bilateral hip replacement.  No acute abnormality noted.

## 2022-07-09 ENCOUNTER — Other Ambulatory Visit: Payer: Self-pay | Admitting: Family Medicine

## 2022-07-09 NOTE — Telephone Encounter (Signed)
Provider not at this practice, will refuse request. Medication not attached to protocol.  Requested Prescriptions  Pending Prescriptions Disp Refills  . amitriptyline (ELAVIL) 25 MG tablet [Pharmacy Med Name: AMITRIPTYLINE '25MG'$  TABLETS] 90 tablet 3    Sig: TAKE 1 TABLET(25 MG) BY MOUTH AT BEDTIME     There is no refill protocol information for this order

## 2022-11-28 ENCOUNTER — Ambulatory Visit: Payer: 59 | Admitting: Sports Medicine

## 2022-11-28 VITALS — BP 124/76 | Ht 64.25 in | Wt 167.0 lb

## 2022-11-28 DIAGNOSIS — M5410 Radiculopathy, site unspecified: Secondary | ICD-10-CM | POA: Diagnosis not present

## 2022-11-28 DIAGNOSIS — M7062 Trochanteric bursitis, left hip: Secondary | ICD-10-CM

## 2022-11-28 MED ORDER — AMITRIPTYLINE HCL 25 MG PO TABS: 25.0000 mg | ORAL_TABLET | Freq: Every day | ORAL | 3 refills | Status: DC

## 2022-11-28 MED ORDER — NITROGLYCERIN 0.2 MG/HR TD PT24: MEDICATED_PATCH | TRANSDERMAL | 0 refills | Status: AC

## 2022-11-28 NOTE — Progress Notes (Addendum)
SUBJECTIVE:   CHIEF COMPLAINT / HPI:   Right Buttock Pain:  [name] [name] has presence of pain in the right buttock down the thigh that began a few months ago.  The pain is much worse with sitting, reports that she has radiation of pain down the lateral aspect of the leg to the calf.  [name] [name] denies any foot drop, bowel or bladder incontinence, saddle anesthesia.  She has intermittent lower back pain that is not severe.  [name] [name] reports that when she is driving, she has to pull over to stretch because of the pain.  [name] [name] previously weaned from amitriptyline for neuropathic pain in the upper extremity.  This was prior to the onset of her symptoms.  Left Hip Pain:  [name] [name] reports of left lateral hip pain onset a couple of months ago without trauma to cause the onset.  She likes to do Pilates and has not noticed any radicular pain down the left lower extremity. Normal strength of the left leg.   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 She has had multiple bone marrow biopsies over the ASIS and is receiving umbilical cord treatment with MD Ouida Sills. She has a spleen size now down to 5 cm. Working toward bone marrow transplant with brother in the future.   OBJECTIVE:  BP 124/76   Ht 5' 4.25" (1.632 m)   Wt 167 lb (75.8 kg)   BMI 28.44 kg/m  Physical Exam   Right Hip/Back Exam:  Inspection: Unremarkable  Palpable tenderness: None. Range of Motion:  Flexion 45 deg; Extension 45 deg;  Right Leg strength: Quad: 5/5 Hamstring: 5/5 Hip flexor: 5/5 Hip abductors: 5/5  Strength at foot: Plantar-flexion: 5/5 Dorsi-flexion: 5/5 Eversion: 5/5 Inversion: 5/5  Sensory change: Gross sensation intact to all lumbar and sacral dermatomes.  Reflexes: 2+ at both patellar tendons, 2+ at achilles tendons, Babinski's downgoing.  Gait  unremarkable. SLR laying: Negative  FABER: negative.   Left Hip Exam:  TTP over great trochanter  FROM  SLR laying: Negative  FADIR: Negative  FABER: negative. Leg strength: Quad: 5/5 Hamstring: 5/5 Hip flexor: 5/5 Hip abductors: 3/5   ASSESSMENT/PLAN:  Radicular pain Assessment & Plan: [name] [name] with left sided radicular pain likely originating from the sciatic nerve.  Reassuringly, without decrease in muscle strength or reflex change.  [name] [name] without other red flag symptoms.  Will continue with Williams flexion training exercises.  She also is amenable to restarting amitriptyline for neuropathic pain.  If symptoms persist, we will have the [name] [name] return and could consider further imaging with x-ray versus MRI.   Greater trochanteric bursitis of left hip Assessment & Plan: Correlates with symptoms presented and physical examination.  Will continue with home exercise program and continue with nitroglycerin patches as they have been beneficial for her in the past.  If symptoms persist, consider steroid injection for symptom relief.   Other orders -     Amitriptyline HCl; Take 1 tablet (25 mg total) by mouth at bedtime.  Dispense: 30 tablet; Refill: 3 -     Nitroglycerin; PLACE 1/4 OF PATCH ONTO THE SKIN EVERY DAY.  Dispense: 30 patch; Refill: 0   No follow-ups on file. Erskine Emery, MD 11/28/2022, 4:48 PM PGY-2, [place]  I observed and examined the [name] [name] with the resident and agree with assessment and plan.  Note reviewed and modified by me.  Ila Mcgill, MD

## 2022-11-28 NOTE — Assessment & Plan Note (Signed)
Correlates with symptoms presented and physical examination.  Will continue with home exercise program and continue with nitroglycerin patches as they have been beneficial for her in the past.  If symptoms persist, consider steroid injection for symptom relief.

## 2022-11-28 NOTE — Patient Instructions (Signed)

## 2022-11-28 NOTE — Assessment & Plan Note (Addendum)
Patient with right sided radicular pain likely originating from the sciatic nerve.  Reassuringly, without decrease in muscle strength or reflex change.  Patient without other red flag symptoms.  Will continue with Williams flexion training exercises.  She also is amenable to restarting amitriptyline for neuropathic pain.  If symptoms persist, we will have the patient return and could consider further imaging with x-ray versus MRI.

## 2023-04-12 ENCOUNTER — Other Ambulatory Visit: Payer: Self-pay | Admitting: Sports Medicine

## 2023-04-14 ENCOUNTER — Ambulatory Visit: Payer: 59 | Admitting: Physical Therapy

## 2023-04-18 ENCOUNTER — Other Ambulatory Visit: Payer: Self-pay

## 2023-04-18 MED ORDER — AMITRIPTYLINE HCL 25 MG PO TABS: 25.0000 mg | ORAL_TABLET | Freq: Every day | ORAL | 3 refills | Status: DC

## 2023-05-26 ENCOUNTER — Other Ambulatory Visit: Payer: Self-pay

## 2023-05-26 ENCOUNTER — Ambulatory Visit: Payer: 59 | Attending: Obstetrics and Gynecology | Admitting: Physical Therapy

## 2023-05-26 DIAGNOSIS — M6281 Muscle weakness (generalized): Secondary | ICD-10-CM | POA: Insufficient documentation

## 2023-05-26 DIAGNOSIS — R279 Unspecified lack of coordination: Secondary | ICD-10-CM | POA: Diagnosis present

## 2023-05-26 DIAGNOSIS — R293 Abnormal posture: Secondary | ICD-10-CM | POA: Diagnosis present

## 2023-05-26 NOTE — Patient Instructions (Signed)
Urge Incontinence  Ideal urination frequency is every 2-4 wakeful hours, which equates to 5-8 times within a 24-hour period.   Urge incontinence is leakage that occurs when the bladder muscle contracts, creating a sudden need to go before getting to the bathroom.   Going too often when your bladder isn't actually full can disrupt the body's automatic signals to store and hold urine longer, which will increase urgency/frequency.  In this case, the bladder "is running the show" and strategies can be learned to retrain this pattern.   One should be able to control the first urge to urinate, at around 150mL.  The bladder can hold up to a "grande latte," or 400mL. To help you gain control, practice the Urge Drill below when urgency strikes.  This drill will help retrain your bladder signals and allow you to store and hold urine longer.  The overall goal is to stretch out your time between voids to reach a more manageable voiding schedule.    Practice your "quick flicks" often throughout the day (each waking hour) even when you don't need feel the urge to go.  This will help strengthen your pelvic floor muscles, making them more effective in controlling leakage.  Urge Drill  When you feel an urge to go, follow these steps to regain control: Stop what you are doing and be still Take one deep breath, directing your air into your abdomen Think an affirming thought, such as "I've got this." Do 5 quick flicks of your pelvic floor Walk with control to the bathroom to void, or delay voiding   Bladder Irritants  Certain foods and beverages can be irritating to the bladder.  Avoiding these irritants may decrease your symptoms of urinary urgency, frequency or bladder pain.  Even reducing your intake can help with your symptoms.  Not everyone is sensitive to all bladder irritants, so you may consider focusing on one irritant at a time, removing or reducing your intake of that irritant for 7-10 days to see if  this change helps your symptoms.  Water intake is also very important.  Below is a list of bladder irritants.  Drinks: alcohol, carbonated beverages, caffeinated beverages such as coffee and tea, drinks with artificial sweeteners, citrus juices, apple juice, tomato juice  Foods: tomatoes and tomato based foods, spicy food, sugar and artificial sweeteners, vinegar, chocolate, raw onion, apples, citrus fruits, pineapple, cranberries, tomatoes, strawberries, plums, peaches, cantaloupe  Other: acidic urine (too concentrated) - see water intake info below  Substitutes you can try that are NOT irritating to the bladder: cooked onion, pears, papayas, sun-brewed decaf teas, watermelons, non-citrus herbal teas, apricots, kava and low-acid instant drinks (Postum).    WATER INTAKE: Remember to drink lots of water (aim for fluid intake of half your body weight with 2/3 of fluids being water).  You may be limiting fluids due to fear of leakage, but this can actually worsen urgency symptoms due to highly concentrated urine.  Water helps balance the pH of your urine so it doesn't become too acidic - acidic urine is a bladder irritant!    

## 2023-05-26 NOTE — Therapy (Unsigned)
OUTPATIENT PHYSICAL THERAPY FEMALE PELVIC EVALUATION   Patient Name: [name] [name] MRN: [id number] DOB:1961/02/14, 62 y.o., female Today's Date: 05/26/2023  END OF SESSION:  PT End of Session - 05/26/23 1618     Visit Number 1    Date for PT Re-Evaluation 08/26/23    Authorization Type [employer]    PT Start Time 1615    PT Stop Time 1656    PT Time Calculation (min) 41 min    Activity Tolerance Patient tolerated treatment well    Behavior During Therapy WFL for tasks assessed/performed             Past Medical History:  Diagnosis Date   Anemia    FALL 2015 - IRON INFUSIONS X2 NOV 2015 - HX OF HEAVY MENSES / UTERINE FIBROIDS   Arthritis    osteoarthritis- hips/back   Complication of anesthesia    SPINAL FOR HIP REPLACMENT - BLOOD PRESSURE DROPPED SEVERELY AND KEPT IN RECOVERY ROOM LONGER THAN NORMAL   Gallstones    MID CHEST PAIN, SOME BACK PAIN AND RIGHT UPPER ABD PAIN   GERD (gastroesophageal reflux disease)    H/O seasonal allergies    Hiatal hernia 04/01/2005   Dr. Jarold Motto    Sinus drainage 10/18/13   Past Surgical History:  Procedure Laterality Date   CESAREAN SECTION     CHOLECYSTECTOMY N/A 11/16/2014   Procedure: LAPAROSCOPIC CHOLECYSTECTOMY;  Surgeon: Emelia Loron, MD;  Location: [facility];  Service: General;  Laterality: N/A;   COLONOSCOPY W/ POLYPECTOMY     ESOPHAGOGASTRODUODENOSCOPY  04/01/2005   HH and GERD    TOTAL HIP ARTHROPLASTY Left 10/19/2013   Procedure: LEFT TOTAL HIP ARTHROPLASTY ANTERIOR APPROACH; LEFT;  Surgeon: Shelda Pal, MD;  Location: [facility];  Service: Orthopedics;  Laterality: Left;   Patient Active Problem List   Diagnosis Date Noted   Radicular pain 11/28/2022   Greater trochanteric bursitis of left hip 11/28/2022   Bone spur of posterior portion of left calcaneus 08/12/2019   Calcific supraspinatus tendinitis 02/27/2017   Osteoarthritis of right hip 09/03/2016   Abdominal pain, epigastric 08/11/2014   Microcytic anemia  08/11/2014   Expected blood loss anemia 10/20/2013   Overweight (BMI 25.0-29.9) 10/20/2013   S/P left THA, 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/19/2013   ABDOMINAL PAIN, GENERALIZED 03/22/2008   URI 11/26/2007   CONTUSION, RIGHT FOOT 07/14/2007   RHINITIS, ALLERGIC 01/08/2007    PCP: Georgette Shell PA-C  REFERRING PROVIDER: Sherre Scarlet, MD  REFERRING DIAG: N32.81 (ICD-10-CM) - Overactive bladder N39.3 (ICD-10-CM) - Stress incontinence (female) (female)  THERAPY DIAG:  Muscle weakness (generalized)  Abnormal posture  Unspecified lack of coordination  Rationale for Evaluation and Treatment: Rehabilitation  ONSET DATE: several years more than 20 years  SUBJECTIVE:  SUBJECTIVE STATEMENT: Laughing, sneezing, coughing, vomiting, jumping causes leakage in smaller amounts. Sometimes worse than others. Wears panty liners during the day  Fluid intake: Yes: water - 5-6 glasses per day; coffee in am, sometimes unsweetened tea, carbonated water 1x daily    PAIN:  Are you having pain? No   PRECAUTIONS: None  RED FLAGS: Bowel or bladder incontinence: Yes: see eval for findings    WEIGHT BEARING RESTRICTIONS: No  FALLS:  Has patient fallen in last 6 months? No  LIVING ENVIRONMENT: Lives with: lives with their family Lives in: House/apartment   OCCUPATION: retired  PLOF: Independent  PATIENT GOALS: to have less leakage  PERTINENT HISTORY:  Lt hip replacement - anterior, csection  Sexual abuse: No  BOWEL MOVEMENT: Pain with bowel movement: No Type of bowel movement:Type (Bristol Stool Scale) 4-6, Frequency daily, and Strain No Fully empty rectum: Yes:   Leakage: No Pads: No Fiber supplement: No  URINATION: Pain with urination: No Fully empty bladder: Yes:   Stream: Strong Urgency: No Frequency: depends,  reports she thinks she goes about every 2 hours but does go when she sees a toilet and not always with an urge Leakage: Coughing, Sneezing, Laughing, and Exercise Pads: Yes: 1 liner a day  INTERCOURSE: Pain with intercourse:  not painful, does report dryness  Ability to have vaginal penetration:  Yes:   Climax: not painful  Marinoff Scale: 0/3  PREGNANCY: Vaginal deliveries 0 Tearing No C-section deliveries 1 Currently pregnant No  PROLAPSE: None   OBJECTIVE:   DIAGNOSTIC FINDINGS:    COGNITION: Overall cognitive status: Within functional limits for tasks assessed     SENSATION: Light touch: Appears intact Proprioception: Appears intact  MUSCLE LENGTH: Bil hamstrings and adductors limited by 25%   POSTURE: rounded shoulders and forward head  PELVIC ALIGNMENT:WFL  LUMBARAROM/PROM:  A/PROM A/PROM  eval  Flexion WFL  Extension WFL  Right lateral flexion Limited by 50%  Left lateral flexion Limited by 50%  Right rotation Limited by 25%  Left rotation Limited by 25%   (Blank rows = not tested)  LOWER EXTREMITY ROM:  WFL  LOWER EXTREMITY MMT:  Hips grossly 4/5, knees 5/5 PALPATION:   General  no TTP but tension in lumbar and bil gluteals, mild fascial restrictions in lower abdominal quadrants                External Perineal Exam mild dryness noted but no pain                             Internal Pelvic Floor no pain  Patient confirms identification and approves PT to assess internal pelvic floor and treatment Yes No emotional/communication barriers or cognitive limitation. Patient is motivated to learn. Patient understands and agrees with treatment goals and plan. PT explains patient will be examined in standing, sitting, and lying down to see how their muscles and joints work. When they are ready, they will be asked to remove their underwear so PT can examine their perineum. The patient is also given the option of providing their own chaperone as one is  not provided in our facility. The patient also has the right and is explained the right to defer or refuse any part of the evaluation or treatment including the internal exam. With the patient's consent, PT will use one gloved finger to gently assess the muscles of the pelvic floor, seeing how well it contracts and relaxes and if there  is muscle symmetry. After, the patient will get dressed and PT and patient will discuss exam findings and plan of care. PT and patient discuss plan of care, schedule, attendance policy and HEP activities.  PELVIC MMT:   MMT eval  Vaginal 4/5, 10s, 4 reps  Internal Anal Sphincter   External Anal Sphincter   Puborectalis   Diastasis Recti   (Blank rows = not tested)        TONE: WFL  PROLAPSE: Not seen in hooklying   TODAY'S TREATMENT:                                                                                                                              DATE:   05/26/23 EVAL Examination completed, findings reviewed, pt educated on POC, HEP, and bladder irritants, urge drill . Pt motivated to participate in PT and agreeable to attempt recommendations.     PATIENT EDUCATION:  Education details: [phone] Person educated: Patient Education method: Explanation, Demonstration, Tactile cues, Verbal cues, and Handouts Education comprehension: verbalized understanding and returned demonstration  HOME EXERCISE PROGRAM: [id number]  ASSESSMENT:  CLINICAL IMPRESSION: Patient is a 62 y.o. female  who was seen today for physical therapy evaluation and treatment for urinary leakage. Pt reports this has been a problem for 20 years and would like to cut back leakage as much as possible. Pt has leakage with stressors mostly.  Pt found to have decreased hip and core strength, decreased flexibility at spine and hips, tension at lumbar and gluteals, and fascial restrictions at lower abdomen. Patient consented to internal pelvic floor assessment vaginally this date and  found to have decreased strength, endurance, and coordination. Patient benefited from verbal cues for improved technique with pelvic floor contractions and coordination of breathing. Pt would benefit from additional PT to further address deficits.    OBJECTIVE IMPAIRMENTS: decreased coordination, decreased endurance, decreased mobility, decreased strength, increased fascial restrictions, impaired flexibility, improper body mechanics, and postural dysfunction.   ACTIVITY LIMITATIONS: continence  PARTICIPATION LIMITATIONS: interpersonal relationship and community activity  PERSONAL FACTORS: Time since onset of injury/illness/exacerbation are also affecting patient's functional outcome.   REHAB POTENTIAL: Good  CLINICAL DECISION MAKING: Stable/uncomplicated  EVALUATION COMPLEXITY: Low   GOALS: Goals reviewed with patient? Yes  SHORT TERM GOALS: Target date: 06/23/23  *** Baseline: Goal status: INITIAL  2.  *** Baseline:  Goal status: INITIAL  3.  *** Baseline:  Goal status: INITIAL  4.  *** Baseline:  Goal status: INITIAL  5.  *** Baseline:  Goal status: INITIAL  6.  *** Baseline:  Goal status: INITIAL  LONG TERM GOALS: Target date: 08/26/23  *** Baseline:  Goal status: INITIAL  2.  *** Baseline:  Goal status: INITIAL  3.  *** Baseline:  Goal status: INITIAL  4.  *** Baseline:  Goal status: INITIAL  5.  *** Baseline:  Goal status: INITIAL  6.  *** Baseline:  Goal status: INITIAL  PLAN:  PT FREQUENCY: {  rehab frequency:25116}  PT DURATION: {rehab duration:25117}  PLANNED INTERVENTIONS: {rehab planned interventions:25118::"Therapeutic exercises","Therapeutic activity","Neuromuscular re-education","Balance training","Gait training","Patient/Family education","Self Care","Joint mobilization"}  PLAN FOR NEXT SESSION: ***   Barbaraann Faster, PT 05/26/2023, 5:00 PM

## 2023-06-11 ENCOUNTER — Ambulatory Visit: Payer: 59 | Admitting: Physical Therapy

## 2023-06-11 DIAGNOSIS — M6281 Muscle weakness (generalized): Secondary | ICD-10-CM | POA: Diagnosis not present

## 2023-06-11 DIAGNOSIS — R293 Abnormal posture: Secondary | ICD-10-CM

## 2023-06-11 DIAGNOSIS — R279 Unspecified lack of coordination: Secondary | ICD-10-CM

## 2023-06-11 NOTE — Patient Instructions (Signed)

## 2023-06-11 NOTE — Therapy (Signed)
OUTPATIENT PHYSICAL THERAPY FEMALE PELVIC TREATMENT   Patient Name: Ajene Pipher MRN: [id number] DOB:April 21, 1961, 62 y.o., female Today's Date: 06/11/2023  END OF SESSION:  PT End of Session - 06/11/23 1236     Visit Number 2    Date for PT Re-Evaluation 08/26/23    Authorization Type [employer]et[employer]    PT Start Time 1230    PT Stop Time 1312    PT Time Calculation (min) 42 min    Activity Tolerance Patient tolerated treatment well    Behavior During Therapy WFL for tasks assessed/performed              Past Medical History:  Diagnosis Date   Anemia    FALL 2015 - IRON INFUSIONS X2 NOV 2015 - HX OF HEAVY MENSES / UTERINE FIBROIDS   Arthritis    osteoarthritis- hips/back   Complication of anesthesia    SPINAL FOR HIP REPLACMENT - BLOOD PRESSURE DROPPED SEVERELY AND KEPT IN RECOVERY ROOM LONGER THAN NORMAL   Gallstones    MID CHEST PAIN, SOME BACK PAIN AND RIGHT UPPER ABD PAIN   GERD (gastroesophageal reflux disease)    H/O seasonal allergies    Hiatal hernia 04/01/2005   Dr. Jarold Motto    Sinus drainage 10/18/13   Past Surgical History:  Procedure Laterality Date   CESAREAN SECTION     CHOLECYSTECTOMY N/A 11/16/2014   Procedure: LAPAROSCOPIC CHOLECYSTECTOMY;  Surgeon: Emelia Loron, MD;  Location: [facility];  Service: General;  Laterality: N/A;   COLONOSCOPY W/ POLYPECTOMY     ESOPHAGOGASTRODUODENOSCOPY  04/01/2005   HH and GERD    TOTAL HIP ARTHROPLASTY Left 10/19/2013   Procedure: LEFT TOTAL HIP ARTHROPLASTY ANTERIOR APPROACH; LEFT;  Surgeon: Shelda Pal, MD;  Location: [facility];  Service: Orthopedics;  Laterality: Left;   Patient Active Problem List   Diagnosis Date Noted   Radicular pain 11/28/2022   Greater trochanteric bursitis of left hip 11/28/2022   Bone spur of posterior portion of left calcaneus 08/12/2019   Calcific supraspinatus tendinitis 02/27/2017   Osteoarthritis of right hip 09/03/2016   Abdominal pain, epigastric 08/11/2014   Microcytic anemia  08/11/2014   Expected blood loss anemia 10/20/2013   Overweight (BMI 25.0-29.9) 10/20/2013   S/P left THA, AA 10/19/2013   ABDOMINAL PAIN, GENERALIZED 03/22/2008   URI 11/26/2007   CONTUSION, RIGHT FOOT 07/14/2007   RHINITIS, ALLERGIC 01/08/2007    PCP: Georgette Shell PA-C  REFERRING PROVIDER: Sherre Scarlet, MD  REFERRING DIAG: N32.81 (ICD-10-CM) - Overactive bladder N39.3 (ICD-10-CM) - Stress incontinence (female) (female)  THERAPY DIAG:  Muscle weakness (generalized)  Abnormal posture  Unspecified lack of coordination  Rationale for Evaluation and Treatment: Rehabilitation  ONSET DATE: several years more than 20 years  SUBJECTIVE:  SUBJECTIVE STATEMENT: Pt reports she has leakage with sneezing and coughing but hasn't been able do HEP but has attempted knack and sometime worked.   Fluid intake: Yes: water - 5-6 glasses per day; coffee in am, sometimes unsweetened tea, carbonated water 1x daily    PAIN:  Are you having pain? No   PRECAUTIONS: None  RED FLAGS: Bowel or bladder incontinence: Yes: see eval for findings    WEIGHT BEARING RESTRICTIONS: No  FALLS:  Has patient fallen in last 6 months? No  LIVING ENVIRONMENT: Lives with: lives with their family Lives in: House/apartment   OCCUPATION: retired  PLOF: Independent  PATIENT GOALS: to have less leakage  PERTINENT HISTORY:  Lt hip replacement - anterior, csection  Sexual abuse: No  BOWEL MOVEMENT: Pain with bowel movement: No Type of bowel movement:Type (Bristol Stool Scale) 4-6, Frequency daily, and Strain No Fully empty rectum: Yes:   Leakage: No Pads: No Fiber supplement: No  URINATION: Pain with urination: No Fully empty bladder: Yes:   Stream: Strong Urgency: No Frequency: depends, reports she  thinks she goes about every 2 hours but does go when she sees a toilet and not always with an urge Leakage: Coughing, Sneezing, Laughing, and Exercise Pads: Yes: 1 liner a day  INTERCOURSE: Pain with intercourse:  not painful, does report dryness  Ability to have vaginal penetration:  Yes:   Climax: not painful  Marinoff Scale: 0/3  PREGNANCY: Vaginal deliveries 0 Tearing No C-section deliveries 1 Currently pregnant No  PROLAPSE: None   OBJECTIVE:   DIAGNOSTIC FINDINGS:    COGNITION: Overall cognitive status: Within functional limits for tasks assessed     SENSATION: Light touch: Appears intact Proprioception: Appears intact  MUSCLE LENGTH: Bil hamstrings and adductors limited by 25%   POSTURE: rounded shoulders and forward head  PELVIC ALIGNMENT:WFL  LUMBARAROM/PROM:  A/PROM A/PROM  eval  Flexion WFL  Extension WFL  Right lateral flexion Limited by 50%  Left lateral flexion Limited by 50%  Right rotation Limited by 25%  Left rotation Limited by 25%   (Blank rows = not tested)  LOWER EXTREMITY ROM:  WFL  LOWER EXTREMITY MMT:  Hips grossly 4/5, knees 5/5 PALPATION:   General  no TTP but tension in lumbar and bil gluteals, mild fascial restrictions in lower abdominal quadrants                External Perineal Exam mild dryness noted but no pain                             Internal Pelvic Floor no pain  Patient confirms identification and approves PT to assess internal pelvic floor and treatment Yes No emotional/communication barriers or cognitive limitation. Patient is motivated to learn. Patient understands and agrees with treatment goals and plan. PT explains patient will be examined in standing, sitting, and lying down to see how their muscles and joints work. When they are ready, they will be asked to remove their underwear so PT can examine their perineum. The patient is also given the option of providing their own chaperone as one is not provided  in our facility. The patient also has the right and is explained the right to defer or refuse any part of the evaluation or treatment including the internal exam. With the patient's consent, PT will use one gloved finger to gently assess the muscles of the pelvic floor, seeing how well it contracts and relaxes  and if there is muscle symmetry. After, the patient will get dressed and PT and patient will discuss exam findings and plan of care. PT and patient discuss plan of care, schedule, attendance policy and HEP activities.  PELVIC MMT:   MMT eval  Vaginal 3-4/5, 10s, 4 reps  Internal Anal Sphincter   External Anal Sphincter   Puborectalis   Diastasis Recti   (Blank rows = not tested)        TONE: WFL  PROLAPSE: Not seen in hooklying   TODAY'S TREATMENT:                                                                                                                              DATE:   06/11/23: NMRE: all exercises cued for breathing and pelvic floor strengthening coordination for decreased leakage Ball squeezes 2x10 Opp hand/knee press 2x10 Sidelying hip abduction with ball squeezes 2x10 2x10 Sit to stand from mat table  Blue band palloffs 2x10 each  Rotational palloffs x10 blue band    PATIENT EDUCATION:  Education details: [id number] Person educated: Patient Education method: Explanation, Demonstration, Tactile cues, Verbal cues, and Handouts Education comprehension: verbalized understanding and returned demonstration  HOME EXERCISE PROGRAM: [email or web address]  ASSESSMENT:  CLINICAL IMPRESSION: Patient presets for treatment, focus on coordination of pelvic floor and breathing with cues for technique. Pt demonstrated need for reps and extra time to improve coordination of pelvic floor mobility with exercises and breathing. Patient benefited from verbal cues for improved technique with pelvic floor contractions and coordination of breathing. Pt would benefit from additional PT to  further address deficits.    OBJECTIVE IMPAIRMENTS: decreased coordination, decreased endurance, decreased mobility, decreased strength, increased fascial restrictions, impaired flexibility, improper body mechanics, and postural dysfunction.   ACTIVITY LIMITATIONS: continence  PARTICIPATION LIMITATIONS: interpersonal relationship and community activity  PERSONAL FACTORS: Time since onset of injury/illness/exacerbation are also affecting patient's functional outcome.   REHAB POTENTIAL: Good  CLINICAL DECISION MAKING: Stable/uncomplicated  EVALUATION COMPLEXITY: Low   GOALS: Goals reviewed with patient? Yes  SHORT TERM GOALS: Target date: 06/23/23  Pt to be I with HEP.  Baseline: Goal status: INITIAL  2.  Pt to report improved time between bladder voids to at least 2 hours for improved QOL with decreased urinary frequency.   Baseline:  Goal status: INITIAL  3.  Pt to demonstrate improved coordination of pelvic floor and breathing mechanics with 10# squat with appropriate synergistic patterns to decrease pain and leakage at least 50% of the time.    Baseline:  Goal status: INITIAL   LONG TERM GOALS: Target date: 08/26/23  Pt to be I with advanced HEP.  Baseline:  Goal status: INITIAL  2.  Pt to demonstrate at least 5/5 bil hip strength for improved pelvic stability and functional squats without leakage.  Baseline:  Goal status: INITIAL  3.  Pt to report improved time between bladder voids to at least 2.5 hours for improved QOL with  decreased urinary frequency.    Baseline:  Goal status: INITIAL  4.  Pt to demonstrate at least 4/5 pelvic floor strength for at least 8s for improved pelvic stability and decreased strain at pelvic floor/ decrease leakage.  Baseline:  Goal status: INITIAL  5.  Pt to demonstrate improved coordination of pelvic floor and breathing mechanics with 15# squat with appropriate synergistic patterns to decrease pain and leakage at least 75% of the  time.    Baseline:  Goal status: INITIAL   PLAN:  PT FREQUENCY: 1x/week  PT DURATION:  8 sessions  PLANNED INTERVENTIONS: Therapeutic exercises, Therapeutic activity, Neuromuscular re-education, Patient/Family education, Self Care, Joint mobilization, Aquatic Therapy, Dry Needling, Spinal mobilization, Cryotherapy, Moist heat, scar mobilization, Taping, Biofeedback, Manual therapy, and Re-evaluation  PLAN FOR NEXT SESSION: coordination of pelvic floor and breathing with exercises, core and hip and pelvic floor strengthening, posture strengthening, fast paced exercises with pelvic floor   Otelia Sergeant, PT, DPT 07/31/242:02 PM

## 2023-06-24 ENCOUNTER — Encounter: Payer: Self-pay | Admitting: Pulmonary Disease

## 2023-06-24 ENCOUNTER — Ambulatory Visit: Payer: 59 | Admitting: Pulmonary Disease

## 2023-06-24 VITALS — BP 130/80 | HR 80 | Temp 97.8°F | Ht 64.0 in | Wt 173.6 lb

## 2023-06-24 DIAGNOSIS — J811 Chronic pulmonary edema: Secondary | ICD-10-CM

## 2023-06-24 NOTE — Progress Notes (Signed)
Synopsis: Referred in by Teena Irani, PA-C   Subjective:   PATIENT ID: [name] [name] GENDER: female DOB: 01/20/1961, MRN: [id number]  Chief Complaint  Patient presents with   pulmonary consult    No current sx.     HPI [name] [name] is a 62 year old female patient with a past medical history of myelofibrosis presenting today to the pulmonary clinic for an inpatient follow-up.  She presented to [facility] in June 2024 for neutropenic fever and was found to be anemic.  As a result she received 4 blood transfusions.  Her course was complicated by hypoxic respiratory failure which prompted further evaluation with a chest x-ray and CTA of the chest that showed pulmonary edema with small bilateral pleural effusions.  She was eventually weaned off oxygen and discharged home.  She present today for follow-up visit.  Her repeat chest x-ray on July 10 shows improvement in pulmonary edema and resolution of the pleural effusions.  She is currently asymptomatic.  Denies shortness of breath chest tightness wheezing cough or sputum production.  She denies any fever chills or night sweats.  Regarding her myelofibrosis, she follows at [facility] in [facility] and is potentially planned for stem cell transplant.  Currently on Jakafi 15 mg twice daily.    Family history: Mother passed away of lung cancer (Heavy smoker)   Social history: Smoked briefly in college otherwise no smoking, drinks 1-2 drinks of wine or tequila 5 times a week, no illicit drug use. Worked in Doctor, hospital. Has a cat and a dog. Has one daughter who is heatlhy.     ROS All systems were reviewed and are negative except for the above.  Objective:   Vitals:   06/24/23 0932  BP: 130/80  Pulse: 80  Temp: 97.8 F (36.6 C)  TempSrc: Temporal  SpO2: 97%  Weight: 173 lb 9.6 oz (78.7 kg)  Height: 5\' 4"  (1.626 m)   97% on RA BMI Readings from Last 3 Encounters:  06/24/23 29.80 kg/m  11/28/22 28.44 kg/m   06/21/21 23.17 kg/m   Wt Readings from Last 3 Encounters:  06/24/23 173 lb 9.6 oz (78.7 kg)  11/28/22 167 lb (75.8 kg)  06/21/21 135 lb (61.2 kg)    Physical Exam GEN: NAD, Healthy Appearing HEENT: Supple Neck, Reactive Pupils, EOMI  CVS: Normal S1, Normal S2, RRR, No murmurs or ES appreciated  Lungs: Clear bilateral air entry.  Abdomen: Soft, non tender, non distended, + BS  Extremities: Warm and well perfused, No edema  Skin: No suspicious lesions appreciated  Psych: Normal Affect  Ancillary Information   CBC    Component Value Date/Time   WBC 7.1 11/09/2014 1421   RBC 5.20 (H) 11/09/2014 1421   HGB 15.3 (H) 11/09/2014 1421   HCT 45.2 11/09/2014 1421   PLT 600 (H) 11/09/2014 1421   MCV 86.9 11/09/2014 1421   MCH 29.4 11/09/2014 1421   MCHC 33.8 11/09/2014 1421   RDW 22.0 (H) 11/09/2014 1421   LYMPHSABS 0.6 (L) 11/09/2014 1421   MONOABS 0.3 11/09/2014 1421   EOSABS 0.1 11/09/2014 1421   BASOSABS 0.1 11/09/2014 1421    Imaging  CXR 05/21/2023: Improving bilateral pulmonary edema and resolved bilateral pleural effusions.  CT angio chest 05/12/2023: Small right pleural effusion and mild atelectasis within bilateral lower lobes and right middle lobe.  PFTs July 2023: Normal spirometry.  Normal TLC.  There is mild reduction of the diffusing capacity.     No data to display  Assessment & Plan:  [name] [name] is a 62 year old female patient with a past medical history of myelofibrosis presenting today to the pulmonary clinic for an inpatient follow-up.  #Pulmonary infiltrate on chest x-ray recently with improving ifiltrate on the last CXR 07/10.   This likely was representing TRALI vs TACO.  Currently completely asymptomatic which is reassuring.  Off any oxygen.  Lungs sound clear on exam.  []  Chest x-ray in 4 weeks to document resolving pulmonary edema.  # Myelofibrosis with ongoing evaluation for stem cell therapy.  []  Order PFTs in 4 weeks. []   Follow-up within 3 months for clearance.  Return in about 3 months (around 09/24/2023).  I spent 60 minutes caring for this patient today, including preparing to see the patient, obtaining a medical history , reviewing a separately obtained history, performing a medically appropriate examination and/or evaluation, counseling and educating the patient/family/caregiver, ordering medications, tests, or procedures, documenting clinical information in the electronic health record, and independently interpreting results (not separately reported/billed) and communicating results to the patient/family/caregiver  Janann Colonel, MD [facility] 06/24/2023 11:29 AM

## 2023-07-22 ENCOUNTER — Ambulatory Visit: Payer: 59 | Admitting: Physical Therapy

## 2023-08-05 ENCOUNTER — Ambulatory Visit: Payer: 59 | Admitting: Physical Therapy

## 2023-08-12 ENCOUNTER — Encounter: Payer: 59 | Admitting: Physical Therapy

## 2023-08-19 ENCOUNTER — Encounter: Payer: 59 | Admitting: Physical Therapy

## 2023-08-26 ENCOUNTER — Encounter: Payer: 59 | Admitting: Physical Therapy

## 2023-08-27 ENCOUNTER — Other Ambulatory Visit: Payer: Self-pay | Admitting: Sports Medicine

## 2023-09-08 ENCOUNTER — Other Ambulatory Visit: Payer: Self-pay

## 2023-09-08 MED ORDER — AMITRIPTYLINE HCL 25 MG PO TABS: 25.0000 mg | ORAL_TABLET | Freq: Every day | ORAL | 2 refills | Status: DC

## 2023-10-14 ENCOUNTER — Encounter: Payer: Self-pay | Admitting: Family Medicine

## 2023-10-14 ENCOUNTER — Ambulatory Visit (INDEPENDENT_AMBULATORY_CARE_PROVIDER_SITE_OTHER): Payer: 59 | Admitting: Family Medicine

## 2023-10-14 VITALS — BP 128/80 | Ht 64.0 in | Wt 175.0 lb

## 2023-10-14 DIAGNOSIS — M5410 Radiculopathy, site unspecified: Secondary | ICD-10-CM

## 2023-10-14 MED ORDER — AMITRIPTYLINE HCL 25 MG PO TABS: 25.0000 mg | ORAL_TABLET | Freq: Every day | ORAL | 3 refills | Status: DC

## 2023-10-14 NOTE — Progress Notes (Addendum)
PCP: Dani Gobble, PA-C  Chief Complaint: f/u radicular symptoms Subjective:   HPI: Patient is a 62 y.o. female here for Bilateral radicular-like symptoms as well as some greater trochanteric pain syndrome.  Patient was seen in January of this year and it was at that time we advised to restart her amitriptyline for the pain.  Patient states that ever since taking amitriptyline she is doing well and has both pain relief as well as able to sleep better.  Patient does plan on going to [place] for a prolonged period for chemo treatments.  Patient would like to continue with the medication while there.  Patient otherwise has no other concerns at this time..    Past Medical History:  Diagnosis Date   Anemia    FALL 2015 - IRON INFUSIONS X2 NOV 2015 - HX OF HEAVY MENSES / UTERINE FIBROIDS   Arthritis    osteoarthritis- hips/back   Complication of anesthesia    SPINAL FOR HIP REPLACMENT - BLOOD PRESSURE DROPPED SEVERELY AND KEPT IN RECOVERY ROOM LONGER THAN NORMAL   Gallstones    MID CHEST PAIN, SOME BACK PAIN AND RIGHT UPPER ABD PAIN   GERD (gastroesophageal reflux disease)    H/O seasonal allergies    Hiatal hernia 04/01/2005   Dr. Jarold Motto    Sinus drainage 10/18/13    Current Outpatient Medications on File Prior to Visit  Medication Sig Dispense Refill   acetaminophen (TYLENOL) 500 MG tablet Take 500 mg by mouth every 6 (six) hours as needed for mild pain, fever or headache.     cetirizine (ZYRTEC) 10 MG tablet Take 10 mg by mouth daily as needed for allergies.      fluticasone (FLONASE) 50 MCG/ACT nasal spray Place 1 spray into the nose daily. Use 1 squirt to each nostril daily as needed for sinus symptoms     folic acid (FOLVITE) 400 MCG tablet Take 400 mcg by mouth daily.     hydroxyurea (HYDREA) 500 MG capsule TAKE 2 CAPSULES BY MOUTH ON MONDAY, WEDNESDAY, AND FRIDAY, AND 1 CAPSULE ON ALL OTHER DAYS     ibuprofen (ADVIL,MOTRIN) 200 MG tablet Take 200 mg by mouth every 8  (eight) hours as needed for headache or mild pain.      metronidazole (NORITATE) 1 % cream Apply 1 application topically daily.     nitroGLYCERIN (NITRODUR - DOSED IN MG/24 HR) 0.2 mg/hr patch PLACE 1/4 OF PATCH ONTO THE SKIN EVERY DAY. 30 patch 0   Omega-3 Fatty Acids (FISH OIL) 1000 MG CAPS Take 2,000 mg by mouth daily.     omeprazole (PRILOSEC) 40 MG capsule Take 40 mg by mouth daily. Take one capsule by mouth one time daily     predniSONE (DELTASONE) 20 MG tablet Take 1 tablet (20 mg total) by mouth 2 (two) times daily. (Patient not taking: Reported on 06/24/2023) 14 tablet 0   pseudoephedrine-guaifenesin (MUCINEX D) 60-600 MG per tablet Take 1 tablet by mouth 2 (two) times daily as needed for congestion.      TURMERIC PO Take by mouth.     vitamin B-12 (CYANOCOBALAMIN) 1000 MCG tablet Take 1,000 mcg by mouth daily.     zolpidem (AMBIEN) 5 MG tablet Take 5 mg by mouth at bedtime as needed. Taking 1/2 tab at nighttime as needed. (Patient not taking: Reported on 06/24/2023)     No current facility-administered medications on file prior to visit.    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   Procedure: LEFT TOTAL HIP ARTHROPLASTY ANTERIOR APPROACH; LEFT;  Surgeon: Shelda Pal, MD;  Location: [facility];  Service: Orthopedics;  Laterality: Left;    Allergies  Allergen Reactions   Nitrofurantoin Hives    BP 128/80   Ht 5\' 4"  (1.626 m)   Wt 175 lb (79.4 kg)   BMI 30.04 kg/m      06/21/2021    2:17 PM  [facility]  Frequency of aerobic exercise (# of days/week) 3  Average time in minutes 60  Frequency of strengthening activities (# of  days/week) 3        No data to display              Objective:  Physical Exam:  Gen: NAD, comfortable in exam room  Inspection: No gross abnormalities No tenderness to palpation over the bilateral greater trochanters.  Range of motion is preserved of the knees. Strength is 5 out of 5 of the Lumbar nerve roots   Assessment & Plan:  1. 1. Radicular pain likely sciatic -Patient's bilateral radicular pain improving with amitriptyline, at this time would not change medication or dosage.  Advised patient to continue current dosing and to follow-up as needed when she can. Patient to call for refill while she is in [place] and can send to pharmacy of her choosing.  Patient otherwise is doing well and has no other concerns at this time.    Brenton Grills MD, PGY-4  Sports Medicine Fellow [facility]  Addendum:  Patient seen and examined in the office by fellow.   History, exam, plan of care were precepted with me.  Agree with findings as documented in fellow note.  Darene Lamer, DO, CAQSM

## 2024-04-27 ENCOUNTER — Other Ambulatory Visit: Payer: Self-pay

## 2024-04-27 MED ORDER — AMITRIPTYLINE HCL 25 MG PO TABS: 25.0000 mg | ORAL_TABLET | Freq: Every day | ORAL | 3 refills | Status: DC

## 2024-08-23 ENCOUNTER — Other Ambulatory Visit: Payer: Self-pay | Admitting: Family Medicine

## 2024-11-30 ENCOUNTER — Other Ambulatory Visit: Payer: Self-pay | Admitting: Family Medicine
